# Patient Record
Sex: Female | Born: 1945 | Race: White | Hispanic: No | Marital: Single | Smoking: Never smoker
Health system: Southern US, Community
[De-identification: ages and names within clinical notes are randomized; demographics above are authoritative.]

## PROBLEM LIST (undated history)

## (undated) ENCOUNTER — Ambulatory Visit: Discharge status: Absent without leave | Payer: Medicare HMO

## (undated) DIAGNOSIS — M1990 Unspecified osteoarthritis, unspecified site: Secondary | ICD-10-CM

## (undated) DIAGNOSIS — D219 Benign neoplasm of connective and other soft tissue, unspecified: Secondary | ICD-10-CM

## (undated) DIAGNOSIS — D649 Anemia, unspecified: Secondary | ICD-10-CM

## (undated) DIAGNOSIS — N920 Excessive and frequent menstruation with regular cycle: Secondary | ICD-10-CM

## (undated) DIAGNOSIS — J302 Other seasonal allergic rhinitis: Secondary | ICD-10-CM

## (undated) HISTORY — DX: Benign neoplasm of connective and other soft tissue, unspecified: D21.9

## (undated) HISTORY — DX: Excessive and frequent menstruation with regular cycle: N92.0

## (undated) HISTORY — PX: CERVICAL SPINE SURGERY: SHX589

---

## 1981-09-22 HISTORY — PX: DILATION AND CURETTAGE OF UTERUS: SHX78

## 1991-09-23 DIAGNOSIS — D219 Benign neoplasm of connective and other soft tissue, unspecified: Secondary | ICD-10-CM

## 1991-09-23 HISTORY — DX: Benign neoplasm of connective and other soft tissue, unspecified: D21.9

## 1992-02-21 HISTORY — PX: ABDOMINAL HYSTERECTOMY: SHX81

## 2005-03-06 ENCOUNTER — Ambulatory Visit: Payer: Self-pay | Admitting: Family Medicine

## 2006-04-02 ENCOUNTER — Ambulatory Visit: Payer: Self-pay | Admitting: Family Medicine

## 2006-11-23 ENCOUNTER — Ambulatory Visit: Payer: Self-pay | Admitting: Family Medicine

## 2007-04-08 ENCOUNTER — Ambulatory Visit: Payer: Self-pay | Admitting: Family Medicine

## 2008-03-17 ENCOUNTER — Ambulatory Visit: Payer: Self-pay

## 2008-03-20 ENCOUNTER — Ambulatory Visit: Payer: Self-pay

## 2008-06-05 ENCOUNTER — Ambulatory Visit: Payer: Self-pay

## 2009-07-05 ENCOUNTER — Ambulatory Visit: Payer: Self-pay

## 2010-08-01 ENCOUNTER — Ambulatory Visit: Payer: Self-pay | Admitting: Nurse Practitioner

## 2012-08-05 ENCOUNTER — Ambulatory Visit: Payer: Self-pay

## 2013-10-29 ENCOUNTER — Ambulatory Visit: Payer: Self-pay | Admitting: Family Medicine

## 2013-10-29 LAB — URINALYSIS, COMPLETE
Bilirubin,UR: NEGATIVE
Blood: NEGATIVE
GLUCOSE, UR: NEGATIVE mg/dL (ref 0–75)
KETONE: NEGATIVE
NITRITE: NEGATIVE
PH: 6.5 (ref 4.5–8.0)
PROTEIN: NEGATIVE
SPECIFIC GRAVITY: 1.015 (ref 1.003–1.030)

## 2013-10-29 LAB — CBC WITH DIFFERENTIAL/PLATELET
Basophil #: 0.1 10*3/uL (ref 0.0–0.1)
Basophil %: 0.6 %
Eosinophil #: 0.1 10*3/uL (ref 0.0–0.7)
Eosinophil %: 1.1 %
HCT: 40.9 % (ref 35.0–47.0)
HGB: 13.7 g/dL (ref 12.0–16.0)
Lymphocyte #: 1.5 10*3/uL (ref 1.0–3.6)
Lymphocyte %: 14.1 %
MCH: 29.5 pg (ref 26.0–34.0)
MCHC: 33.4 g/dL (ref 32.0–36.0)
MCV: 88 fL (ref 80–100)
MONO ABS: 1 x10 3/mm — AB (ref 0.2–0.9)
Monocyte %: 9 %
Neutrophil #: 8 10*3/uL — ABNORMAL HIGH (ref 1.4–6.5)
Neutrophil %: 75.2 %
Platelet: 244 10*3/uL (ref 150–440)
RBC: 4.64 10*6/uL (ref 3.80–5.20)
RDW: 13.2 % (ref 11.5–14.5)
WBC: 10.7 10*3/uL (ref 3.6–11.0)

## 2013-10-29 LAB — RAPID INFLUENZA A&B ANTIGENS

## 2013-10-29 LAB — RAPID STREP-A WITH REFLX: Micro Text Report: NEGATIVE

## 2013-10-31 LAB — URINE CULTURE

## 2013-11-01 LAB — BETA STREP CULTURE(ARMC)

## 2014-04-02 ENCOUNTER — Emergency Department: Payer: Self-pay | Admitting: Emergency Medicine

## 2014-04-02 LAB — COMPREHENSIVE METABOLIC PANEL
ALBUMIN: 3.8 g/dL (ref 3.4–5.0)
ALK PHOS: 60 U/L
Anion Gap: 6 — ABNORMAL LOW (ref 7–16)
BUN: 10 mg/dL (ref 7–18)
Bilirubin,Total: 0.4 mg/dL (ref 0.2–1.0)
Calcium, Total: 9 mg/dL (ref 8.5–10.1)
Chloride: 107 mmol/L (ref 98–107)
Co2: 28 mmol/L (ref 21–32)
Creatinine: 0.98 mg/dL (ref 0.60–1.30)
EGFR (African American): >60
EGFR (Non-African Amer.): 60 — ABNORMAL LOW
Glucose: 87 mg/dL (ref 65–99)
Osmolality: 280 (ref 275–301)
Potassium: 4.2 mmol/L (ref 3.5–5.1)
SGOT(AST): 24 U/L (ref 15–37)
SGPT (ALT): 20 U/L (ref 12–78)
SODIUM: 141 mmol/L (ref 136–145)
Total Protein: 8 g/dL (ref 6.4–8.2)

## 2014-04-02 LAB — URINALYSIS, COMPLETE
BLOOD: NEGATIVE
Bacteria: NONE SEEN
Bilirubin,UR: NEGATIVE
Glucose,UR: NEGATIVE mg/dL (ref 0–75)
Ketone: NEGATIVE
Leukocyte Esterase: NEGATIVE
Nitrite: NEGATIVE
PH: 7 (ref 4.5–8.0)
PROTEIN: NEGATIVE
RBC, UR: NONE SEEN /HPF (ref 0–5)
Specific Gravity: 1.003 (ref 1.003–1.030)
WBC UR: NONE SEEN /HPF (ref 0–5)

## 2014-04-02 LAB — CBC
HCT: 38.2 % (ref 35.0–47.0)
HGB: 12.5 g/dL (ref 12.0–16.0)
MCH: 29.4 pg (ref 26.0–34.0)
MCHC: 32.7 g/dL (ref 32.0–36.0)
MCV: 90 fL (ref 80–100)
Platelet: 207 10*3/uL (ref 150–440)
RBC: 4.25 10*6/uL (ref 3.80–5.20)
RDW: 13.2 % (ref 11.5–14.5)
WBC: 4.9 10*3/uL (ref 3.6–11.0)

## 2014-04-02 LAB — TROPONIN I

## 2014-04-14 ENCOUNTER — Emergency Department: Payer: Self-pay

## 2014-04-14 LAB — CBC WITH DIFFERENTIAL/PLATELET
Basophil #: 0.1 10*3/uL (ref 0.0–0.1)
Basophil %: 1 %
EOS ABS: 0.1 10*3/uL (ref 0.0–0.7)
EOS PCT: 1.5 %
HCT: 38.7 % (ref 35.0–47.0)
HGB: 12.6 g/dL (ref 12.0–16.0)
LYMPHS ABS: 1.3 10*3/uL (ref 1.0–3.6)
Lymphocyte %: 22.6 %
MCH: 29.6 pg (ref 26.0–34.0)
MCHC: 32.6 g/dL (ref 32.0–36.0)
MCV: 91 fL (ref 80–100)
MONOS PCT: 10.4 %
Monocyte #: 0.6 x10 3/mm (ref 0.2–0.9)
NEUTROS ABS: 3.8 10*3/uL (ref 1.4–6.5)
NEUTROS PCT: 64.5 %
Platelet: 234 10*3/uL (ref 150–440)
RBC: 4.27 10*6/uL (ref 3.80–5.20)
RDW: 13.3 % (ref 11.5–14.5)
WBC: 5.9 10*3/uL (ref 3.6–11.0)

## 2014-04-14 LAB — COMPREHENSIVE METABOLIC PANEL
AST: 25 U/L (ref 15–37)
Albumin: 3.7 g/dL (ref 3.4–5.0)
Alkaline Phosphatase: 61 U/L
Anion Gap: 7 (ref 7–16)
BILIRUBIN TOTAL: 0.2 mg/dL (ref 0.2–1.0)
BUN: 11 mg/dL (ref 7–18)
Calcium, Total: 9.1 mg/dL (ref 8.5–10.1)
Chloride: 106 mmol/L (ref 98–107)
Co2: 27 mmol/L (ref 21–32)
Creatinine: 0.93 mg/dL (ref 0.60–1.30)
EGFR (Non-African Amer.): >60
Glucose: 106 mg/dL — ABNORMAL HIGH (ref 65–99)
Osmolality: 279 (ref 275–301)
Potassium: 4.3 mmol/L (ref 3.5–5.1)
SGPT (ALT): 23 U/L
Sodium: 140 mmol/L (ref 136–145)
TOTAL PROTEIN: 8 g/dL (ref 6.4–8.2)

## 2014-04-14 LAB — URINALYSIS, COMPLETE
BILIRUBIN, UR: NEGATIVE
Blood: NEGATIVE
GLUCOSE, UR: NEGATIVE mg/dL (ref 0–75)
Ketone: NEGATIVE
LEUKOCYTE ESTERASE: NEGATIVE
Nitrite: NEGATIVE
PROTEIN: NEGATIVE
Ph: 6 (ref 4.5–8.0)
RBC,UR: <1 /HPF (ref 0–5)
SPECIFIC GRAVITY: 1.004 (ref 1.003–1.030)
WBC UR: <1 /HPF (ref 0–5)

## 2014-04-14 LAB — TROPONIN I: Troponin-I: <0.02 ng/mL

## 2014-07-17 ENCOUNTER — Ambulatory Visit: Payer: Self-pay

## 2014-07-17 LAB — URINALYSIS, COMPLETE
Bilirubin,UR: NEGATIVE
Blood: NEGATIVE
GLUCOSE, UR: NEGATIVE
Ketone: NEGATIVE
Nitrite: NEGATIVE
PROTEIN: NEGATIVE
Ph: 6 (ref 5.0–8.0)
Specific Gravity: 1.01 (ref 1.000–1.030)
WBC UR: >30 /HPF (ref 0–5)

## 2014-07-19 LAB — URINE CULTURE

## 2015-01-23 ENCOUNTER — Other Ambulatory Visit: Payer: Self-pay | Admitting: Student

## 2015-01-23 DIAGNOSIS — Z13820 Encounter for screening for osteoporosis: Secondary | ICD-10-CM

## 2015-01-24 ENCOUNTER — Other Ambulatory Visit: Payer: Self-pay | Admitting: Student

## 2015-01-24 DIAGNOSIS — E278 Other specified disorders of adrenal gland: Secondary | ICD-10-CM

## 2015-02-20 ENCOUNTER — Ambulatory Visit: Payer: Medicare HMO

## 2015-02-20 ENCOUNTER — Ambulatory Visit: Payer: Self-pay

## 2015-02-22 ENCOUNTER — Other Ambulatory Visit: Payer: Self-pay

## 2015-03-06 ENCOUNTER — Ambulatory Visit
Admission: RE | Admit: 2015-03-06 | Discharge: 2015-03-06 | Disposition: A | Discharge status: Home / Self Care | Payer: Medicare HMO | Source: Ambulatory Visit | Attending: Student | Admitting: Student

## 2015-03-06 DIAGNOSIS — Z13820 Encounter for screening for osteoporosis: Secondary | ICD-10-CM

## 2015-03-06 DIAGNOSIS — E279 Disorder of adrenal gland, unspecified: Secondary | ICD-10-CM | POA: Insufficient documentation

## 2015-03-06 DIAGNOSIS — E278 Other specified disorders of adrenal gland: Secondary | ICD-10-CM

## 2016-02-20 ENCOUNTER — Other Ambulatory Visit (HOSPITAL_COMMUNITY): Payer: Self-pay | Admitting: Family Medicine

## 2016-02-20 DIAGNOSIS — E278 Other specified disorders of adrenal gland: Secondary | ICD-10-CM

## 2016-02-29 ENCOUNTER — Ambulatory Visit
Admission: RE | Admit: 2016-02-29 | Discharge: 2016-02-29 | Disposition: A | Discharge status: Home / Self Care | Payer: Medicare HMO | Source: Ambulatory Visit | Attending: Family Medicine | Admitting: Family Medicine

## 2016-02-29 DIAGNOSIS — D3502 Benign neoplasm of left adrenal gland: Secondary | ICD-10-CM | POA: Diagnosis not present

## 2016-02-29 DIAGNOSIS — E278 Other specified disorders of adrenal gland: Secondary | ICD-10-CM

## 2016-02-29 DIAGNOSIS — E279 Disorder of adrenal gland, unspecified: Secondary | ICD-10-CM | POA: Diagnosis present

## 2017-01-01 ENCOUNTER — Ambulatory Visit
Admission: EM | Admit: 2017-01-01 | Discharge: 2017-01-01 | Disposition: A | Discharge status: Home / Self Care | Payer: Medicare HMO | Attending: Emergency Medicine | Admitting: Emergency Medicine

## 2017-01-01 ENCOUNTER — Ambulatory Visit (INDEPENDENT_AMBULATORY_CARE_PROVIDER_SITE_OTHER): Payer: Medicare HMO

## 2017-01-01 DIAGNOSIS — M545 Low back pain, unspecified: Secondary | ICD-10-CM

## 2017-01-01 DIAGNOSIS — R030 Elevated blood-pressure reading, without diagnosis of hypertension: Secondary | ICD-10-CM

## 2017-01-01 LAB — BASIC METABOLIC PANEL
ANION GAP: 6 (ref 5–15)
BUN: 10 mg/dL (ref 6–20)
CO2: 27 mmol/L (ref 22–32)
Calcium: 9.2 mg/dL (ref 8.9–10.3)
Chloride: 99 mmol/L — ABNORMAL LOW (ref 101–111)
Creatinine, Ser: 0.82 mg/dL (ref 0.44–1.00)
GFR calc non Af Amer: >60 mL/min (ref >60)
Glucose, Bld: 90 mg/dL (ref 65–99)
POTASSIUM: 3.7 mmol/L (ref 3.5–5.1)
Sodium: 132 mmol/L — ABNORMAL LOW (ref 135–145)

## 2017-01-01 LAB — URINALYSIS, COMPLETE (UACMP) WITH MICROSCOPIC
Bilirubin Urine: NEGATIVE
Glucose, UA: NEGATIVE mg/dL
Hgb urine dipstick: NEGATIVE
KETONES UR: NEGATIVE mg/dL
LEUKOCYTES UA: NEGATIVE
Nitrite: NEGATIVE
PH: 6.5 (ref 5.0–8.0)
Protein, ur: NEGATIVE mg/dL
SPECIFIC GRAVITY, URINE: 1.01 (ref 1.005–1.030)

## 2017-01-01 MED ORDER — TRAMADOL HCL 50 MG PO TABS: 50.0000 mg | ORAL_TABLET | Freq: Four times a day (QID) | ORAL | 0 refills | Status: DC | PRN

## 2017-01-01 MED ORDER — ACETAMINOPHEN 500 MG PO TABS
1000.0000 mg | ORAL_TABLET | Freq: Once | ORAL | Status: AC
  Administered 2017-01-01: 1000 mg via ORAL

## 2017-01-01 MED ORDER — KETOROLAC TROMETHAMINE 60 MG/2ML IM SOLN: 30.0000 mg | Freq: Once | INTRAMUSCULAR | Status: DC

## 2017-01-01 MED ORDER — IBUPROFEN 600 MG PO TABS: 600.0000 mg | ORAL_TABLET | Freq: Four times a day (QID) | ORAL | 0 refills | Status: DC | PRN

## 2017-01-01 MED ORDER — PREDNISONE 20 MG PO TABS: 40.0000 mg | ORAL_TABLET | Freq: Every day | ORAL | 0 refills | Status: AC

## 2017-01-01 NOTE — ED Provider Notes (Signed)
HPI  SUBJECTIVE:  Janet Scott is a 71 y.o. female who presents with intermittent low back pain that she describes as sore, lasting minutes, states that it alternates between one side and the other for the past 11-13 days. States that it radiates down the lateral thighs occasionally. She denies it  being like spasm or pulling. She tried cranberry juice, ibuprofen 600 mg on a regular basis, physical therapy exercises and heat. Ibuprofen seemed to help most. Symptoms are worse with going from sitting to lying, turning over in bed, walking, bending forward. Was picking up sticks several days prior to back pains starting. denies N/V, fevers, flank pain, abdominal pain, urinary urgency, frequency, dysuria, cloudy or odorous urine, hematuria.  No syncope. No saddle anesthesia, distal weakness/numbness, bilateral radicular leg pain/weakness, fevers/night sweats, recent h/o trauma, neurological deficits,  bladder/ bowel incontinence, urinary retention, h/o CA / multiple myleoma, unexplained weight loss, pain worse at night,  h/o prolonged steroid use, h/o osteopenia, h/o IVDU, h/o HIV,  known AAA.  States feels similar to previous episodes of back pain. no h/o nephrolithiasis.    PMH:  pyleonephritis L adrenal gland mass, osteopenia No DM, HTN, GIB, ulcers.   Allergy to etodolac. etodolac- redness only, no lip swelling difficulty breathing.  PMD: [facility]  History reviewed. No pertinent past medical history.  History reviewed. No pertinent surgical history.  History reviewed. No pertinent family history.  Social History  Substance Use Topics  . Smoking status: Never Smoker  . Smokeless tobacco: Never Used  . Alcohol use No     Current Facility-Administered Medications:  .  ketorolac (TORADOL) injection 30 mg, 30 mg, Intramuscular, Once, Melynda Ripple, MD  Current Outpatient Prescriptions:  .  ibuprofen (ADVIL,MOTRIN) 600 MG tablet, Take 1 tablet (600 mg total) by mouth  every 6 (six) hours as needed., Disp: 30 tablet, Rfl: 0 .  predniSONE (DELTASONE) 20 MG tablet, Take 2 tablets (40 mg total) by mouth daily with breakfast., Disp: 10 tablet, Rfl: 0 .  traMADol (ULTRAM) 50 MG tablet, Take 1 tablet (50 mg total) by mouth every 6 (six) hours as needed., Disp: 20 tablet, Rfl: 0  Allergies  Allergen Reactions  . Lodine [Etodolac] Photosensitivity     ROS  As noted in HPI.   Physical Exam  BP (!) 178/82 (BP Location: Right Arm)   Pulse 78   Temp 98 F (36.7 C) (Oral)   Resp 18   Ht 5\' 2"  (1.575 m)   Wt 168 lb (76.2 kg)   SpO2 100%   BMI 30.73 kg/m    Constitutional: Well developed, well nourished, no acute distress Eyes:  EOMI, conjunctiva normal bilaterally HENT: Normocephalic, atraumatic,mucus membranes moist Respiratory: Normal inspiratory effort Cardiovascular: Normal rate GI: nondistended. No suprapubic tenderness, Flank tenderness  skin: No rash, skin intact Musculoskeletal: no CVAT. - paralumbar tenderness But she points to this area as the location of her pain, - muscle spasm. No bony tenderness Along the L-spine, sacrum, SI joint. Bilateral lower extremities nontender without new rashes or color change, baseline ROM with intact DP pulses, . No pain with int/ext rotation flex/extension hips bilaterally. No pain with active flexion/extension of hips. SLR neg bilaterally. Sensation baseline light touch bilaterally for Pt, DTR's symmetric and intact bilaterally KJ, Motor symmetric bilateral 5/5 hip flexion, quadriceps, hamstrings, EHL, foot dorsiflexion, foot plantarflexion, gait normal. Neurologic: Alert & oriented x 3, no focal neuro deficits Psychiatric: Speech and behavior appropriate   ED Course   Medications  ketorolac (  TORADOL) injection 30 mg (not administered)  acetaminophen (TYLENOL) tablet 1,000 mg (1,000 mg Oral Given 01/01/17 1143)    Orders Placed This Encounter  Procedures  . DG Lumbar Spine Complete    Standing Status:    Standing    Number of Occurrences:   1    Order Specific Question:   Reason for Exam (SYMPTOM  OR DIAGNOSIS REQUIRED)    Answer:   alternating LBP x 12 days  . Urinalysis, Complete w Microscopic    Standing Status:   Standing    Number of Occurrences:   1  . Basic metabolic panel    Standing Status:   Standing    Number of Occurrences:   1  . Measure blood pressure    Standing Status:   Standing    Number of Occurrences:   1    Results for orders placed or performed during the hospital encounter of 01/01/17 (from the past 24 hour(s))  Urinalysis, Complete w Microscopic     Status: Abnormal   Collection Time: 01/01/17 10:54 AM  Result Value Ref Range   Color, Urine YELLOW YELLOW   APPearance CLEAR CLEAR   Specific Gravity, Urine 1.010 1.005 - 1.030   pH 6.5 5.0 - 8.0   Glucose, UA NEGATIVE NEGATIVE mg/dL   Hgb urine dipstick NEGATIVE NEGATIVE   Bilirubin Urine NEGATIVE NEGATIVE   Ketones, ur NEGATIVE NEGATIVE mg/dL   Protein, ur NEGATIVE NEGATIVE mg/dL   Nitrite NEGATIVE NEGATIVE   Leukocytes, UA NEGATIVE NEGATIVE   Squamous Epithelial / LPF 6-30 (A) NONE SEEN   WBC, UA 0-5 0 - 5 WBC/hpf   RBC / HPF 0-5 0 - 5 RBC/hpf   Bacteria, UA FEW (A) NONE SEEN  Basic metabolic panel     Status: Abnormal   Collection Time: 01/01/17 11:40 AM  Result Value Ref Range   Sodium 132 (L) 135 - 145 mmol/L   Potassium 3.7 3.5 - 5.1 mmol/L   Chloride 99 (L) 101 - 111 mmol/L   CO2 27 22 - 32 mmol/L   Glucose, Bld 90 65 - 99 mg/dL   BUN 10 6 - 20 mg/dL   Creatinine, Ser 0.82 0.44 - 1.00 mg/dL   Calcium 9.2 8.9 - 10.3 mg/dL   GFR calc non Af Amer >60 >60 mL/min   GFR calc Af Amer >60 >60 mL/min   Anion gap 6 5 - 15   Dg Lumbar Spine Complete  Result Date: 01/01/2017 CLINICAL DATA:  Low back pain EXAM: LUMBAR SPINE - COMPLETE 4+ VIEW COMPARISON:  Lumbar MRI 11/23/2006 FINDINGS: Negative for fracture or mass lesion.  Mild scoliosis. 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 This is unchanged from the prior MRI. Mild retrolisthesis L3-4 L4-5 also unchanged. Disc degeneration and spurring at L2-3, L3-4, L4-5, and L5-S1. Constipation with stool throughout the colon IMPRESSION: Multilevel degenerative changes similar to the MRI of 11/23/2006 10 mm anterolisthesis L5-S1 due to bilateral pars defects of L5. There is foraminal encroachment bilaterally at L5-S1. Electronically Signed   By: Franchot Gallo M.D.   On: 01/01/2017 11:49    ED Clinical Impression  Acute bilateral low back pain without sciatica  Elevated blood pressure reading without diagnosis of hypertension   ED Assessment/Plan  [place] narcotic database reviewed. Pt with no narcotic rx in the past 6 months. .  No previous blood pressures for comparison.  No evidence of uti, nephrolithiasis.    Imaging independently reviewed. Constipation. Multilevel degenerative changes  similar to MRI from 2008, 10 mm anterolisthesis L5-S1 with for minimal encroachment at L5-S1. See radiology report for details.  checking BMP to evaluate kidney function, L-spine to evaluate for acute bony changes, aortic abdominal aneurysm, giving Toradol 30 and 1 g of Tylenol. Had extensive discussion with patient about her allergy to etodolac, she states is just diffuse redness and wants to try Toradol especially as she has been tolerating ibuprofen. Also checking UA. We'll reevaluate.   UA: No UTI, hematuria. Slightly contaminated sample. BMP normal creatinine.   Ended up not giving pt tordaol.   Pt hypertensive today. Had several high measurements in the 170-180/80-90 Does not measure her blood pressure at home because she has no previous diagnosis hypertension. Pt has no historical evidence of end organ damage. Pt denies any CNS type sx such as HA, visual changes, focal paresis, or new onset seizure activity. Pt denies any CV sx such as CP, dyspnea, palpitations, pedal edema, tearing pain radiating to back or abd. Pt denied any  renal sx such as anuria or hematuria. Pt denies illicit drug use, most notably cocaine, or recent use of OTC medications such as nasal decongestants. Discussed importance of lifestyle modifications as important first steps. Patient to get a blood pressure monitor and keep a log of her blood pressure at home.. Pt to f/u as OP.    Pt ambulatory in the ED. Home with NSAID/APAP, tramadol, steriod. Pt to f/u with PMD.  Discussed labs, medical decision-making, and plan for follow-up with the patient.  Discussed signs and symptoms that should prompt return to the emergency department.  Patient agrees with plan.  Meds ordered this encounter  Medications  . ketorolac (TORADOL) injection 30 mg  . acetaminophen (TYLENOL) tablet 1,000 mg  . ibuprofen (ADVIL,MOTRIN) 600 MG tablet    Sig: Take 1 tablet (600 mg total) by mouth every 6 (six) hours as needed.    Dispense:  30 tablet    Refill:  0  . predniSONE (DELTASONE) 20 MG tablet    Sig: Take 2 tablets (40 mg total) by mouth daily with breakfast.    Dispense:  10 tablet    Refill:  0  . traMADol (ULTRAM) 50 MG tablet    Sig: Take 1 tablet (50 mg total) by mouth every 6 (six) hours as needed.    Dispense:  20 tablet    Refill:  0    *This clinic note was created using [employer]. Therefore, there may be occasional mistakes despite careful proofreading.  ?    Melynda Ripple, MD 01/01/17 1235

## 2017-01-01 NOTE — ED Triage Notes (Signed)
Pt c/o back pain, Easter Sunday she woke up and she felt like her lower back was tight. And it has continued. She has been taking Ibuprofen to help some, the tightness comes and goes and she has been using a heating pad.

## 2017-01-01 NOTE — Discharge Instructions (Signed)
Blood pressure was high today. That could be due to many factors, but you need to have it rechecked to make sure that you do not have a new diagnosis of hypertension that would require medications. Decrease your salt intake. diet and exercise will lower your blood pressure significantly. It is important to keep your blood pressure under good control, as having a elevated for prolonged periods of time significantly increases your risk of stroke, heart attacks, kidney damage, eye damage, and other problems. Measure your blood pressure once a day, preferably at the same time every day. Keep a log of this and bring it to your next doctor's appointment. Return here in a week for blood pressure recheck if you're able to find a primary care physician by then. Return immediately to the ER if you start having chest pain, headache, problems seeing, problems talking, problems walking, if you feel like you're about to pass out, if you do pass out, if you have a seizure, or for any other concerns.Janet Scott

## 2017-04-07 ENCOUNTER — Encounter: Payer: Self-pay | Admitting: Emergency Medicine

## 2017-04-07 ENCOUNTER — Emergency Department
Admission: EM | Admit: 2017-04-07 | Discharge: 2017-04-07 | Disposition: A | Discharge status: Home / Self Care | Payer: Medicare HMO | Attending: Emergency Medicine | Admitting: Emergency Medicine

## 2017-04-07 DIAGNOSIS — Y929 Unspecified place or not applicable: Secondary | ICD-10-CM | POA: Diagnosis not present

## 2017-04-07 DIAGNOSIS — S39012A Strain of muscle, fascia and tendon of lower back, initial encounter: Secondary | ICD-10-CM | POA: Diagnosis not present

## 2017-04-07 DIAGNOSIS — Y999 Unspecified external cause status: Secondary | ICD-10-CM | POA: Diagnosis not present

## 2017-04-07 DIAGNOSIS — X58XXXA Exposure to other specified factors, initial encounter: Secondary | ICD-10-CM | POA: Diagnosis not present

## 2017-04-07 DIAGNOSIS — Y939 Activity, unspecified: Secondary | ICD-10-CM | POA: Diagnosis not present

## 2017-04-07 DIAGNOSIS — S3992XA Unspecified injury of lower back, initial encounter: Secondary | ICD-10-CM | POA: Diagnosis present

## 2017-04-07 MED ORDER — TRAMADOL HCL 50 MG PO TABS: 50.0000 mg | ORAL_TABLET | Freq: Two times a day (BID) | ORAL | 0 refills | Status: DC | PRN

## 2017-04-07 MED ORDER — METHYLPREDNISOLONE 4 MG PO TBPK: ORAL_TABLET | ORAL | 0 refills | Status: DC

## 2017-04-07 MED ORDER — METHYLPREDNISOLONE SODIUM SUCC 125 MG IJ SOLR
60.0000 mg | Freq: Once | INTRAMUSCULAR | Status: AC
  Administered 2017-04-07: 60 mg via INTRAMUSCULAR
  Filled 2017-04-07: qty 2

## 2017-04-07 NOTE — ED Notes (Signed)
See triage note developed back pain about 2 weeks ago  W/o injury ambulates well   Had similar episode in past which she was seen for at urgent care  Hx of chronic back pain

## 2017-04-07 NOTE — ED Provider Notes (Signed)
[facility] Emergency Department Provider Note   ____________________________________________   First MD Initiated Contact with Patient 04/07/17 0802     (approximate)  I have reviewed the triage vital signs and the nursing notes.   HISTORY  Chief Complaint Back Pain    HPI Janet Scott is a 71 y.o. female patient complain of back pain for 2 weeks. Patient has a history of intermittent back pain. Patient was seen by PCP but was unable to get an  appointment until 05/05/2017.Patient state onset of pain after sitting on a low sofa 2 weeks ago. Patient rates the pain as a 4/10. Patient describes pain as "aching". Patient denies any radicular component to her pain. Patient denies bladder or bowel dysfunction. No relief with over-the-counter medications.  History reviewed. No pertinent past medical history.  There are no active problems to display for this patient.   History reviewed. No pertinent surgical history.  Prior to Admission medications   Medication Sig Start Date End Date Taking? Authorizing Provider  ibuprofen (ADVIL,MOTRIN) 600 MG tablet Take 1 tablet (600 mg total) by mouth every 6 (six) hours as needed. 01/01/17   Melynda Ripple, MD  methylPREDNISolone (MEDROL DOSEPAK) 4 MG TBPK tablet Take Tapered dose as directed 04/07/17   Sable Feil, PA-C  traMADol (ULTRAM) 50 MG tablet Take 1 tablet (50 mg total) by mouth every 6 (six) hours as needed. 01/01/17   Melynda Ripple, MD  traMADol (ULTRAM) 50 MG tablet Take 1 tablet (50 mg total) by mouth every 12 (twelve) hours as needed. 04/07/17   Sable Feil, PA-C    Allergies Lodine [etodolac]  History reviewed. No pertinent family history.  Social History Social History  Substance Use Topics  . Smoking status: Never Smoker  . Smokeless tobacco: Never Used  . Alcohol use No    Review of Systems  Constitutional: No fever/chills Eyes: No visual changes. ENT: No sore  throat. Cardiovascular: Denies chest pain. Respiratory: Denies shortness of breath. Gastrointestinal: No abdominal pain.  No nausea, no vomiting.  No diarrhea.  No constipation. Genitourinary: Negative for dysuria. Musculoskeletal:Positive for back pain. Skin: Negative for rash. Neurological: Negative for headaches, focal weakness or numbness. Allergic/Immunilogical:  etodolac ____________________________________________   PHYSICAL EXAM:  VITAL SIGNS: ED Triage Vitals  Enc Vitals Group     BP 04/07/17 0738 (!) 137/92     Pulse Rate 04/07/17 0738 91     Resp 04/07/17 0738 16     Temp 04/07/17 0738 98.2 F (36.8 C)     Temp Source 04/07/17 0738 Oral     SpO2 04/07/17 0738 100 %     Weight 04/07/17 0739 168 lb (76.2 kg)     Height --      Head Circumference --      Peak Flow --      Pain Score 04/07/17 0738 4     Pain Loc --      Pain Edu? --      Excl. in [place]? --     Constitutional: Alert and oriented. Well appearing and in no acute distress. Neck: No stridor.  Cardiovascular: Normal rate, regular rhythm. Grossly normal heart sounds.  Good peripheral circulation. Respiratory: Normal respiratory effort.  No retractions. Lungs CTAB. Musculoskeletal: No obvious spinal deformity. There is no guarding palpation of the spinal processes. Patient has decreased range of motion with flexion and lateral movements. No pain with internal and external rotation of the hips. Patient has bilateral negative straight leg  test. Neurologic:  Normal speech and language. No gross focal neurologic deficits are appreciated. No gait instability. Skin:  Skin is warm, dry and intact. No rash noted. Psychiatric: Mood and affect are normal. Speech and behavior are normal.  ____________________________________________   LABS (all labs ordered are listed, but only abnormal results are displayed)  Labs Reviewed - No data to  display ____________________________________________  EKG   ____________________________________________  RADIOLOGY  No results found.  ____________________________________________   PROCEDURES  Procedure(s) performed: None  Procedures  Critical Care performed: No  ____________________________________________   INITIAL IMPRESSION / ASSESSMENT AND PLAN / ED COURSE  Pertinent labs & imaging results that were available during my care of the patient were reviewed by me and considered in my medical decision making (see chart for details).  Lumbar strain. Patient given discharge care instructions. Patient advised follow-up PCP for continual care.      ____________________________________________   FINAL CLINICAL IMPRESSION(S) / ED DIAGNOSES  Final diagnoses:  Strain of lumbar region, initial encounter      NEW MEDICATIONS STARTED DURING THIS VISIT:  New Prescriptions   METHYLPREDNISOLONE (MEDROL DOSEPAK) 4 MG TBPK TABLET    Take Tapered dose as directed   TRAMADOL (ULTRAM) 50 MG TABLET    Take 1 tablet (50 mg total) by mouth every 12 (twelve) hours as needed.     Note:  This document was prepared using Dragon voice recognition software and may include unintentional dictation errors.    Sable Feil, PA-C 04/07/17 0375    Eula Listen, MD 04/07/17 [phone]

## 2017-04-07 NOTE — ED Triage Notes (Signed)
Pt to ed with c/o back pain over the last 2 weeks.  Pt reports hx of back pain several years ago.  Pt states she was in April for same at urgent care in [facility] and was started on prednisone and muscle relaxers with some relief.  Pt ambulates well.

## 2017-04-23 ENCOUNTER — Other Ambulatory Visit: Payer: Self-pay | Admitting: Unknown Physician Specialty

## 2017-04-23 DIAGNOSIS — G8929 Other chronic pain: Principal | ICD-10-CM

## 2017-04-23 DIAGNOSIS — M542 Cervicalgia: Secondary | ICD-10-CM

## 2017-04-23 DIAGNOSIS — M549 Dorsalgia, unspecified: Principal | ICD-10-CM

## 2017-04-27 ENCOUNTER — Ambulatory Visit
Admission: RE | Admit: 2017-04-27 | Discharge: 2017-04-27 | Disposition: A | Discharge status: Home / Self Care | Payer: Medicare HMO | Source: Ambulatory Visit | Attending: Unknown Physician Specialty | Admitting: Unknown Physician Specialty

## 2017-04-27 DIAGNOSIS — M5030 Other cervical disc degeneration, unspecified cervical region: Secondary | ICD-10-CM | POA: Insufficient documentation

## 2017-04-27 DIAGNOSIS — M549 Dorsalgia, unspecified: Secondary | ICD-10-CM | POA: Insufficient documentation

## 2017-04-27 DIAGNOSIS — G8929 Other chronic pain: Secondary | ICD-10-CM

## 2017-04-27 DIAGNOSIS — M542 Cervicalgia: Secondary | ICD-10-CM | POA: Diagnosis present

## 2017-04-27 DIAGNOSIS — M4802 Spinal stenosis, cervical region: Secondary | ICD-10-CM | POA: Diagnosis not present

## 2017-06-01 ENCOUNTER — Encounter: Payer: Self-pay | Admitting: Obstetrics and Gynecology

## 2017-06-01 ENCOUNTER — Ambulatory Visit (INDEPENDENT_AMBULATORY_CARE_PROVIDER_SITE_OTHER): Payer: Medicare HMO | Admitting: Obstetrics and Gynecology

## 2017-06-01 ENCOUNTER — Emergency Department
Admission: EM | Admit: 2017-06-01 | Discharge: 2017-06-01 | Disposition: A | Discharge status: Home / Self Care | Payer: Medicare HMO | Attending: Emergency Medicine | Admitting: Emergency Medicine

## 2017-06-01 ENCOUNTER — Encounter: Payer: Self-pay | Admitting: Emergency Medicine

## 2017-06-01 DIAGNOSIS — Z79899 Other long term (current) drug therapy: Secondary | ICD-10-CM | POA: Diagnosis not present

## 2017-06-01 DIAGNOSIS — N8110 Cystocele, unspecified: Secondary | ICD-10-CM | POA: Diagnosis not present

## 2017-06-01 DIAGNOSIS — N816 Rectocele: Secondary | ICD-10-CM | POA: Diagnosis not present

## 2017-06-01 DIAGNOSIS — N993 Prolapse of vaginal vault after hysterectomy: Secondary | ICD-10-CM

## 2017-06-01 DIAGNOSIS — R102 Pelvic and perineal pain: Secondary | ICD-10-CM | POA: Diagnosis present

## 2017-06-01 DIAGNOSIS — N8189 Other female genital prolapse: Secondary | ICD-10-CM | POA: Diagnosis not present

## 2017-06-01 DIAGNOSIS — N8111 Cystocele, midline: Secondary | ICD-10-CM

## 2017-06-01 NOTE — ED Provider Notes (Signed)
[facility] Emergency Department Provider Note   ____________________________________________    I have reviewed the triage vital signs and the nursing notes.   HISTORY  Chief Complaint Pelvic Pain     HPI Janet Scott is a 71 y.o. female Who presents with complaints of pelvic discomfort. Specifically patient reports that she is having something emerge from her vagina. She reports this started over the last several days and has made her quite anxious. She denies abdominal pain. No dysuria. She is able to urinate well. She has a history of a hysterectomy. She has had 2 kids. She reports occasionally she sees a bulge coming from her vagina but then sometimes it is gone.   History reviewed. No pertinent past medical history.  There are no active problems to display for this patient.   History reviewed. No pertinent surgical history.  Prior to Admission medications   Medication Sig Start Date End Date Taking? Authorizing Provider  ibuprofen (ADVIL,MOTRIN) 600 MG tablet Take 1 tablet (600 mg total) by mouth every 6 (six) hours as needed. 01/01/17   Melynda Ripple, MD  methylPREDNISolone (MEDROL DOSEPAK) 4 MG TBPK tablet Take Tapered dose as directed 04/07/17   Sable Feil, PA-C  traMADol (ULTRAM) 50 MG tablet Take 1 tablet (50 mg total) by mouth every 6 (six) hours as needed. 01/01/17   Melynda Ripple, MD  traMADol (ULTRAM) 50 MG tablet Take 1 tablet (50 mg total) by mouth every 12 (twelve) hours as needed. 04/07/17   Sable Feil, PA-C     Allergies Lodine [etodolac]  No family history on file.  Social History Social History  Substance Use Topics  . Smoking status: Never Smoker  . Smokeless tobacco: Never Used  . Alcohol use No    Review of Systems  Constitutional: No fever/chills Eyes: No visual changes.  ENT: No sore throat. Cardiovascular: Denies chest pain. Respiratory: Denies shortness of breath. Gastrointestinal: No  abdominal pain.  No nausea, no vomiting.   Genitourinary: Negative for dysuria.as above Musculoskeletal: Negative for back pain. Skin: Negative for rash. Neurological: Negative for headaches    ____________________________________________   PHYSICAL EXAM:  VITAL SIGNS: ED Triage Vitals  Enc Vitals Group     BP 06/01/17 0745 (!) 174/79     Pulse Rate 06/01/17 0745 78     Resp 06/01/17 0745 18     Temp 06/01/17 0745 97.9 F (36.6 C)     Temp Source 06/01/17 0745 Oral     SpO2 06/01/17 0745 97 %     Weight --      Height --      Head Circumference --      Peak Flow --      Pain Score 06/01/17 0759 3     Pain Loc --      Pain Edu? --      Excl. in [place]? --     Constitutional: Alert and oriented.  Pleasant and interactive Eyes: Conjunctivae are normal.   Nose: No congestion/rhinnorhea. Mouth/Throat: Mucous membranes are moist.    Cardiovascular: Normal rate, regular rhythm.  Good peripheral circulation. Respiratory: Normal respiratory effort.  No retractions. Lungs CTAB. Gastrointestinal: Soft and nontender. No distention.  No CVA tenderness. Genitourinary: no obvious prolapse on exam however there is likely vaginal wall tissue bulging at the introitus Musculoskeletal:   Warm and well perfused Neurologic:  Normal speech and language. No gross focal neurologic deficits are appreciated.  Skin:  Skin is warm, dry  and intact.  Psychiatric: Mood and affect are normal. Speech and behavior are normal.  ____________________________________________   LABS (all labs ordered are listed, but only abnormal results are displayed)  Labs Reviewed - No data to display ____________________________________________  EKG  None ____________________________________________  RADIOLOGY  None ____________________________________________   PROCEDURES  Procedure(s) performed: No    Critical Care performed: No ____________________________________________   INITIAL IMPRESSION /  ASSESSMENT AND PLAN / ED COURSE  Pertinent labs & imaging results that were available during my care of the patient were reviewed by me and considered in my medical decision making (see chart for details).  Likely prolapsed vaginal wall versus bladder. Discussed with Dr. Kenton Kingfisher of GYN, recommend outpatient follow-up Patient is comfortable with this plan    ____________________________________________   FINAL CLINICAL IMPRESSION(S) / ED DIAGNOSES  Final diagnoses:  Vaginal wall prolapse      NEW MEDICATIONS STARTED DURING THIS VISIT:  Discharge Medication List as of 06/01/2017  8:16 AM       Note:  This document was prepared using Dragon voice recognition software and may include unintentional dictation errors.    Lavonia Drafts, MD 06/01/17 [phone]

## 2017-06-01 NOTE — ED Notes (Signed)
AAOx3.  Skin warm and dry.  NAD 

## 2017-06-01 NOTE — Progress Notes (Signed)
Obstetrics & Gynecology Office Visit   Chief Complaint  Patient presents with  . ER follow up    vaginal wall prolapse   History of Present Illness: 71 y.o. G2P2 female who presents to have a vaginal bulge assessed. The patient states that she stays up-to-date on her health issues. She recently noticed that she had a vaginal bulge.  She presented to the ER this morning.  She has noted this for the last several days.  She denies abdominal pain, vaginal bleeding, issues with urinating.  She does not have to splint to void or to evacuate her bowels. She has a history of total abdominal hysterectomy in the distant past.  She is not sexually active.  She denies issues with urinary incontinence.  She denies issues with her bowels.  The size of the bulge is relatively small and she is not terribly bothered by the bulge.    Past Medical History:  Diagnosis Date  . Leiomyoma 1993  . Menorrhagia     Past Surgical History:  Procedure Laterality Date  . ABDOMINAL HYSTERECTOMY  02/1992   Total  ([facility])  . DILATION AND CURETTAGE OF UTERUS  1983    Gynecologic History: No LMP recorded. Patient has had a hysterectomy.  Obstetric History: G2P2  History reviewed. No pertinent family history.  Social History   Social History  . Marital status: Single    Spouse name: N/A  . Number of children: N/A  . Years of education: N/A   Occupational History  . Not on file.   Social History Main Topics  . Smoking status: Never Smoker  . Smokeless tobacco: Never Used  . Alcohol use No  . Drug use: No  . Sexual activity: Not on file   Other Topics Concern  . Not on file   Social History Narrative  . No narrative on file    Allergies  Allergen Reactions  . Lodine [Etodolac] Photosensitivity    Prior to Admission medications   Medication Sig Start Date End Date Taking? Authorizing Provider  ibuprofen (ADVIL,MOTRIN) 600 MG tablet Take 1 tablet (600 mg total) by mouth every 6 (six) hours  as needed. 01/01/17  Yes Melynda Ripple, MD  methylPREDNISolone (MEDROL DOSEPAK) 4 MG TBPK tablet Take Tapered dose as directed 04/07/17  Yes Sable Feil, PA-C  traMADol (ULTRAM) 50 MG tablet Take 1 tablet (50 mg total) by mouth every 6 (six) hours as needed. 01/01/17  Yes Melynda Ripple, MD    Review of Systems  Constitutional: Negative.   HENT: Negative.   Eyes: Negative.   Respiratory: Negative.   Cardiovascular: Negative.   Gastrointestinal: Negative.   Genitourinary: Negative.   Musculoskeletal: Negative.   Skin: Negative.   Neurological: Negative.   Psychiatric/Behavioral: Negative.      Physical Exam BP 122/80 (BP Location: Left Arm, Patient Position: Sitting, Cuff Size: Normal)   Pulse 77   Ht 5\' 2"  (1.575 m)   Wt 170 lb (77.1 kg)   BMI 31.09 kg/m  No LMP recorded. Patient has had a hysterectomy. Physical Exam  Constitutional: She is oriented to person, place, and time. She appears well-developed and well-nourished. No distress.  Genitourinary: Vagina normal. Pelvic exam was performed with patient supine. There is no rash, tenderness or lesion on the right labia. There is no rash, tenderness or lesion on the left labia. Vagina exhibits no lesion. No erythema, tenderness or bleeding in the vagina. No signs of injury around the vagina. No vaginal discharge found. Right adnexum  does not display mass, does not display tenderness and does not display fullness. Left adnexum does not display mass, does not display tenderness and does not display fullness.   POP-Q measurements were:  Aa: 0, Ba: 0, C: X gh: 4, pb: 5, TVL: 8 Ap: 0, Bp: 0, D: -4Rectal exam shows anal tone abnormal (decreased anal sphincter tone). Rectal exam shows no fissure, no mass and no tenderness.  Genitourinary Comments: Cervix and Uterus surgically absent Anal wink intact  HENT:  Head: Normocephalic and atraumatic.  Eyes: EOM are normal. No scleral icterus.  Neck: Normal range of motion. Neck supple.   Cardiovascular: Normal rate and regular rhythm.  Exam reveals no gallop and no friction rub.   No murmur heard. Pulmonary/Chest: Effort normal and breath sounds normal. No respiratory distress. She has no wheezes. She has no rales.  Abdominal: Soft. Bowel sounds are normal. She exhibits no distension and no mass. There is no tenderness. There is no rebound and no guarding.  Musculoskeletal: Normal range of motion. She exhibits no edema.  Neurological: She is alert and oriented to person, place, and time. No cranial nerve deficit.  Skin: Skin is warm and dry. No erythema.  Psychiatric: She has a normal mood and affect. Her behavior is normal. Judgment normal.   Female chaperone present for pelvic and breast  portions of the physical exam  Assessment: 71 y.o. G2P2 female with Stage 2 pelvic organ prolapse. She has a cystocele, rectocele, and apical prolapse.    Plan: Problem List Items Addressed This Visit    Prolapse of female pelvic organs   Cystocele, midline   Rectocele   Prolapse of vaginal vault after hysterectomy     I have reviewed the findings with the patient and discussed the risks of prolapse. She does not appear to have voiding dysfunction or frequent UTIs.  She does not appear to have bowel dysfunction.  She is not in pain and is only occasionally uncomfortable. She denies urinary and fecal incontinence.  Discussed observation, management with a pessary, and surgical management. She elects observation at this time and accepts reassurance.  I gave her precautions regarding urinary retention, bowel dysfunction, and worsening pain with prolapse.  She will follow up, as needed.   30 minutes spent in face to face discussion with > 50% spent in counseling and management of her prolapse of her vaginal vault after hysterectomy.   Prentice Docker, MD 06/04/2017 1:35 PM

## 2017-06-01 NOTE — ED Triage Notes (Signed)
Pt reports problems with her female organs, feels like something is falling out.

## 2017-06-04 DIAGNOSIS — N819 Female genital prolapse, unspecified: Secondary | ICD-10-CM | POA: Insufficient documentation

## 2017-06-04 DIAGNOSIS — N993 Prolapse of vaginal vault after hysterectomy: Secondary | ICD-10-CM | POA: Insufficient documentation

## 2017-06-04 DIAGNOSIS — N816 Rectocele: Secondary | ICD-10-CM | POA: Insufficient documentation

## 2017-06-04 DIAGNOSIS — N8111 Cystocele, midline: Secondary | ICD-10-CM | POA: Insufficient documentation

## 2017-06-09 ENCOUNTER — Emergency Department
Admission: EM | Admit: 2017-06-09 | Discharge: 2017-06-09 | Disposition: A | Discharge status: Home / Self Care | Payer: Medicare HMO | Attending: Emergency Medicine | Admitting: Emergency Medicine

## 2017-06-09 ENCOUNTER — Emergency Department: Payer: Medicare HMO

## 2017-06-09 ENCOUNTER — Encounter: Payer: Self-pay | Admitting: Emergency Medicine

## 2017-06-09 DIAGNOSIS — R110 Nausea: Secondary | ICD-10-CM | POA: Diagnosis not present

## 2017-06-09 DIAGNOSIS — R1084 Generalized abdominal pain: Secondary | ICD-10-CM | POA: Diagnosis not present

## 2017-06-09 DIAGNOSIS — Z79899 Other long term (current) drug therapy: Secondary | ICD-10-CM | POA: Insufficient documentation

## 2017-06-09 DIAGNOSIS — R195 Other fecal abnormalities: Secondary | ICD-10-CM | POA: Insufficient documentation

## 2017-06-09 LAB — COMPREHENSIVE METABOLIC PANEL
ALBUMIN: 4.4 g/dL (ref 3.5–5.0)
ALK PHOS: 52 U/L (ref 38–126)
ALT: 15 U/L (ref 14–54)
AST: 19 U/L (ref 15–41)
Anion gap: 9 (ref 5–15)
BUN: 11 mg/dL (ref 6–20)
CALCIUM: 9.6 mg/dL (ref 8.9–10.3)
CO2: 25 mmol/L (ref 22–32)
CREATININE: 0.85 mg/dL (ref 0.44–1.00)
Chloride: 105 mmol/L (ref 101–111)
GFR calc Af Amer: >60 mL/min (ref >60)
GFR calc non Af Amer: >60 mL/min (ref >60)
GLUCOSE: 102 mg/dL — AB (ref 65–99)
Potassium: 4 mmol/L (ref 3.5–5.1)
Sodium: 139 mmol/L (ref 135–145)
Total Bilirubin: 0.4 mg/dL (ref 0.3–1.2)
Total Protein: 8.1 g/dL (ref 6.5–8.1)

## 2017-06-09 LAB — URINALYSIS, COMPLETE (UACMP) WITH MICROSCOPIC
Bilirubin Urine: NEGATIVE
GLUCOSE, UA: NEGATIVE mg/dL
Hgb urine dipstick: NEGATIVE
Ketones, ur: NEGATIVE mg/dL
Nitrite: NEGATIVE
PH: 6 (ref 5.0–8.0)
Protein, ur: NEGATIVE mg/dL
Specific Gravity, Urine: 1.006 (ref 1.005–1.030)

## 2017-06-09 LAB — CBC
HCT: 39 % (ref 35.0–47.0)
Hemoglobin: 13.4 g/dL (ref 12.0–16.0)
MCH: 29.9 pg (ref 26.0–34.0)
MCHC: 34.3 g/dL (ref 32.0–36.0)
MCV: 87.2 fL (ref 80.0–100.0)
PLATELETS: 249 10*3/uL (ref 150–440)
RBC: 4.48 MIL/uL (ref 3.80–5.20)
RDW: 13.1 % (ref 11.5–14.5)
WBC: 6.8 10*3/uL (ref 3.6–11.0)

## 2017-06-09 LAB — LIPASE, BLOOD: Lipase: 24 U/L (ref 11–51)

## 2017-06-09 MED ORDER — IOPAMIDOL (ISOVUE-300) INJECTION 61%
100.0000 mL | Freq: Once | INTRAVENOUS | Status: AC | PRN
  Administered 2017-06-09: 100 mL via INTRAVENOUS
  Filled 2017-06-09: qty 100

## 2017-06-09 MED ORDER — MORPHINE SULFATE (PF) 4 MG/ML IV SOLN
4.0000 mg | Freq: Once | INTRAVENOUS | Status: AC
  Administered 2017-06-09: 4 mg via INTRAVENOUS
  Filled 2017-06-09: qty 1

## 2017-06-09 MED ORDER — OXYCODONE-ACETAMINOPHEN 5-325 MG PO TABS: 1.0000 | ORAL_TABLET | Freq: Four times a day (QID) | ORAL | 0 refills | Status: DC | PRN

## 2017-06-09 MED ORDER — ONDANSETRON HCL 4 MG/2ML IJ SOLN
4.0000 mg | Freq: Once | INTRAMUSCULAR | Status: AC
  Administered 2017-06-09: 4 mg via INTRAVENOUS
  Filled 2017-06-09: qty 2

## 2017-06-09 NOTE — Discharge Instructions (Signed)
Please take your pain medication only as needed for severe pain and make an appointment to follow-up with your primary care physician in 2 days for reexamination. Return to the emergency department sooner for any new or worsening symptoms such as fevers, chills, if you cannot eat or drink, for worsening pain, or for any other concerns whatsoever.  It was a pleasure to take care of you today, and thank you for coming to our emergency department.  If you have any questions or concerns before leaving please ask the nurse to grab me and I'm more than happy to go through your aftercare instructions again.  If you were prescribed any opioid pain medication today such as Norco, Vicodin, Percocet, morphine, hydrocodone, or oxycodone please make sure you do not drive when you are taking this medication as it can alter your ability to drive safely.  If you have any concerns once you are home that you are not improving or are in fact getting worse before you can make it to your follow-up appointment, please do not hesitate to call 911 and come back for further evaluation.  Darel Hong, MD  Results for orders placed or performed during the hospital encounter of 06/09/17  Lipase, blood  Result Value Ref Range   Lipase 24 11 - 51 U/L  Comprehensive metabolic panel  Result Value Ref Range   Sodium 139 135 - 145 mmol/L   Potassium 4.0 3.5 - 5.1 mmol/L   Chloride 105 101 - 111 mmol/L   CO2 25 22 - 32 mmol/L   Glucose, Bld 102 (H) 65 - 99 mg/dL   BUN 11 6 - 20 mg/dL   Creatinine, Ser 0.85 0.44 - 1.00 mg/dL   Calcium 9.6 8.9 - 10.3 mg/dL   Total Protein 8.1 6.5 - 8.1 g/dL   Albumin 4.4 3.5 - 5.0 g/dL   AST 19 15 - 41 U/L   ALT 15 14 - 54 U/L   Alkaline Phosphatase 52 38 - 126 U/L   Total Bilirubin 0.4 0.3 - 1.2 mg/dL   GFR calc non Af Amer >60 >60 mL/min   GFR calc Af Amer >60 >60 mL/min   Anion gap 9 5 - 15  CBC  Result Value Ref Range   WBC 6.8 3.6 - 11.0 K/uL   RBC 4.48 3.80 - 5.20 MIL/uL   Hemoglobin 13.4 12.0 - 16.0 g/dL   HCT 39.0 35.0 - 47.0 %   MCV 87.2 80.0 - 100.0 fL   MCH 29.9 26.0 - 34.0 pg   MCHC 34.3 32.0 - 36.0 g/dL   RDW 13.1 11.5 - 14.5 %   Platelets 249 150 - 440 K/uL  Urinalysis, Complete w Microscopic  Result Value Ref Range   Color, Urine YELLOW (A) YELLOW   APPearance CLEAR (A) CLEAR   Specific Gravity, Urine 1.006 1.005 - 1.030   pH 6.0 5.0 - 8.0   Glucose, UA NEGATIVE NEGATIVE mg/dL   Hgb urine dipstick NEGATIVE NEGATIVE   Bilirubin Urine NEGATIVE NEGATIVE   Ketones, ur NEGATIVE NEGATIVE mg/dL   Protein, ur NEGATIVE NEGATIVE mg/dL   Nitrite NEGATIVE NEGATIVE   Leukocytes, UA SMALL (A) NEGATIVE   RBC / HPF 0-5 0 - 5 RBC/hpf   WBC, UA 0-5 0 - 5 WBC/hpf   Bacteria, UA RARE (A) NONE SEEN   Squamous Epithelial / LPF 0-5 (A) NONE SEEN   Ct Abdomen Pelvis W Contrast  Result Date: 06/09/2017 CLINICAL DATA:  Left and right-sided abdominal pain EXAM: CT  ABDOMEN AND PELVIS WITH CONTRAST TECHNIQUE: Multidetector CT imaging of the abdomen and pelvis was performed using the standard protocol following bolus administration of intravenous contrast. CONTRAST:  170mL ISOVUE-300 IOPAMIDOL (ISOVUE-300) INJECTION 61% COMPARISON:  02/29/2016, 04/14/2014 FINDINGS: Lower chest: No acute abnormality. Hepatobiliary: No focal liver abnormality is seen. No gallstones, gallbladder wall thickening, or biliary dilatation. Pancreas: Unremarkable. No pancreatic ductal dilatation or surrounding inflammatory changes. Spleen: Normal in size without focal abnormality. Adrenals/Urinary Tract: Right adrenal gland is normal. 16 mm left adrenal gland nodule consistent with adenoma. No focal parenchymal abnormality of the kidneys. No hydronephrosis. Bladder within normal limits Stomach/Bowel: Stomach is within normal limits. Appendix appears normal. No evidence of bowel wall thickening, distention, or inflammatory changes. Scattered sigmoid colon diverticula but no acute inflammation.  Vascular/Lymphatic: Aortic atherosclerosis. No enlarged abdominal or pelvic lymph nodes. Reproductive: Status post hysterectomy. No adnexal masses. Other: Negative for free air or free fluid. Musculoskeletal: Degenerative changes. 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 No CT evidence for acute intra-abdominal or pelvic pathology 2. Stable 16 mm left adrenal gland nodule consistent with adenoma 3. 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 4. Sigmoid colon diverticula without acute inflammation Electronically Signed   By: Donavan Foil M.D.   On: 06/09/2017 18:20

## 2017-06-09 NOTE — ED Triage Notes (Signed)
Pt c/o pain to right and left side. When asked pt to point points to upper pelvic/lower abdominal area.  Denies NVD, fevers.  Pain for months but worst today per pt.  Gets seen for neck and back pain.  No urinary sx. Ambulatory to triage without difficulty.

## 2017-06-09 NOTE — ED Provider Notes (Signed)
[facility] Emergency Department Provider Note  ____________________________________________   First MD Initiated Contact with Patient 06/09/17 1654     (approximate)  I have reviewed the triage vital signs and the nursing notes.   HISTORY  Chief Complaint Abdominal Pain    HPI Janet Scott is a 71 y.o. female history of presents to the emergency Department with roughly 24 hours of left-sided greater than right-sided moderate to severe intermittent cramping colicky abdominal pain. She's had several loose stools recently but does not describe it as diarrhea. She occasionally has constipation. She has had some nausea but no vomiting. No fevers or chills. Her last bowel movement was this morning. She has a remote abdominal hysterectomy.   Past Medical History:  Diagnosis Date  . Leiomyoma 1993  . Menorrhagia     Patient Active Problem List   Diagnosis Date Noted  . Prolapse of female pelvic organs 06/04/2017  . Cystocele, midline 06/04/2017  . Rectocele 06/04/2017  . Prolapse of vaginal vault after hysterectomy 06/04/2017    Past Surgical History:  Procedure Laterality Date  . ABDOMINAL HYSTERECTOMY  02/1992   Total  ([facility])  . DILATION AND CURETTAGE OF UTERUS  1983    Prior to Admission medications   Medication Sig Start Date End Date Taking? Authorizing Provider  ibuprofen (ADVIL,MOTRIN) 600 MG tablet Take 1 tablet (600 mg total) by mouth every 6 (six) hours as needed. 01/01/17   Melynda Ripple, MD  methylPREDNISolone (MEDROL DOSEPAK) 4 MG TBPK tablet Take Tapered dose as directed 04/07/17   Sable Feil, PA-C  oxyCODONE-acetaminophen (ROXICET) 5-325 MG tablet Take 1 tablet by mouth every 6 (six) hours as needed for severe pain. 06/09/17   Darel Hong, MD  traMADol (ULTRAM) 50 MG tablet Take 1 tablet (50 mg total) by mouth every 6 (six) hours as needed. 01/01/17   Melynda Ripple, MD    Allergies Lodine [etodolac]  History  reviewed. No pertinent family history.  Social History Social History  Substance Use Topics  . Smoking status: Never Smoker  . Smokeless tobacco: Never Used  . Alcohol use No    Review of Systems Constitutional: No fever/chills Eyes: No visual changes. ENT: No sore throat. Cardiovascular: Denies chest pain. Respiratory: Denies shortness of breath. Gastrointestinal: positive abdominal pain.  positivenausea, no vomiting.  No diarrhea.  No constipation. Genitourinary: Negative for dysuria. Musculoskeletal: Negative for back pain. Skin: Negative for rash. Neurological: Negative for headaches, focal weakness or numbness.   ____________________________________________   PHYSICAL EXAM:  VITAL SIGNS: ED Triage Vitals [06/09/17 1408]  Enc Vitals Group     BP (!) 157/76     Pulse Rate 92     Resp 18     Temp 98.6 F (37 C)     Temp Source Oral     SpO2 99 %     Weight 170 lb (77.1 kg)     Height 5\' 2"  (1.575 m)     Head Circumference      Peak Flow      Pain Score 8     Pain Loc      Pain Edu?      Excl. in [place]?     Constitutional: alert and oriented 4 appears extremely uncomfortable pacing around the room no diaphoresis Eyes: PERRL EOMI. Head: Atraumatic. Nose: No congestion/rhinnorhea. Mouth/Throat: No trismus Neck: No stridor.   Cardiovascular: Normal rate, regular rhythm. Grossly normal heart sounds.  Good peripheral circulation. Respiratory: Normal respiratory effort.  No  retractions. Lungs CTAB and moving good air Gastrointestinal: soft nondistended some tenderness left lower quadrant greater than right lower quadrant although with no frank peritonitis Musculoskeletal: No lower extremity edema   Neurologic:  Normal speech and language. No gross focal neurologic deficits are appreciated. Skin:  Skin is warm, dry and intact. No rash noted. Psychiatric: Mood and affect are normal. Speech and behavior are  normal.    ____________________________________________   DIFFERENTIAL includes but not limited to  appendicitis, diverticulitis, volvulus, small bowel obstruction, large bowel obstruction, pyelonephritis, renal colic ____________________________________________   LABS (all labs ordered are listed, but only abnormal results are displayed)  Labs Reviewed  COMPREHENSIVE METABOLIC PANEL - Abnormal; Notable for the following:       Result Value   Glucose, Bld 102 (*)    All other components within normal limits  URINALYSIS, COMPLETE (UACMP) WITH MICROSCOPIC - Abnormal; Notable for the following:    Color, Urine YELLOW (*)    APPearance CLEAR (*)    Leukocytes, UA SMALL (*)    Bacteria, UA RARE (*)    Squamous Epithelial / LPF 0-5 (*)    All other components within normal limits  LIPASE, BLOOD  CBC    blood work unremarkable no signs of urinary tract infection __________________________________________  EKG   ____________________________________________  RADIOLOGY  CT scan reviewed by me no acute disease noted in the abdomen or pelvis ____________________________________________   PROCEDURES  Procedure(s) performed: no  Procedures  Critical Care performed: no  Observation: no ____________________________________________   INITIAL IMPRESSION / ASSESSMENT AND PLAN / ED COURSE  Pertinent labs & imaging results that were available during my care of the patient were reviewed by me and considered in my medical decision making (see chart for details).  The patient arrives uncomfortable appearing. She does have some left lower quadrant tenderness along with intermittent loose stools which raises the concern for diverticulitis. CT scan is pending.    The patient's CT scan is negative for acute intra-abdominal pathology. It does show diverticulosis but no diverticulitis. The patient's pain is markedly improved. At a lengthy discussion with the patient regarding her  symptoms and that she needs to follow up with her primary care physician within 2 days for reexamination. Strict return precautions given. She is discharged home in improved condition.  ____________________________________________   FINAL CLINICAL IMPRESSION(S) / ED DIAGNOSES  Final diagnoses:  Generalized abdominal pain      NEW MEDICATIONS STARTED DURING THIS VISIT:  Discharge Medication List as of 06/09/2017  6:33 PM    START taking these medications   Details  oxyCODONE-acetaminophen (ROXICET) 5-325 MG tablet Take 1 tablet by mouth every 6 (six) hours as needed for severe pain., Starting Tue 06/09/2017, Print         Note:  This document was prepared using Dragon voice recognition software and may include unintentional dictation errors.     Darel Hong, MD 06/09/17 [phone]

## 2017-06-11 ENCOUNTER — Ambulatory Visit: Payer: Self-pay | Admitting: Obstetrics & Gynecology

## 2017-07-01 ENCOUNTER — Encounter: Payer: Self-pay | Admitting: Obstetrics and Gynecology

## 2017-10-21 ENCOUNTER — Other Ambulatory Visit: Payer: Self-pay | Admitting: Family Medicine

## 2017-10-21 DIAGNOSIS — Z1231 Encounter for screening mammogram for malignant neoplasm of breast: Secondary | ICD-10-CM

## 2017-10-21 DIAGNOSIS — Z1239 Encounter for other screening for malignant neoplasm of breast: Secondary | ICD-10-CM

## 2017-10-28 ENCOUNTER — Other Ambulatory Visit: Payer: Self-pay | Admitting: Family Medicine

## 2017-10-28 ENCOUNTER — Ambulatory Visit
Admission: RE | Admit: 2017-10-28 | Discharge: 2017-10-28 | Disposition: A | Discharge status: Home / Self Care | Payer: Medicare HMO | Source: Ambulatory Visit | Attending: Family Medicine | Admitting: Family Medicine

## 2017-10-28 DIAGNOSIS — Z1231 Encounter for screening mammogram for malignant neoplasm of breast: Secondary | ICD-10-CM

## 2017-10-28 DIAGNOSIS — Z1239 Encounter for other screening for malignant neoplasm of breast: Secondary | ICD-10-CM

## 2019-12-23 ENCOUNTER — Encounter: Payer: Self-pay | Admitting: Emergency Medicine

## 2019-12-23 ENCOUNTER — Other Ambulatory Visit: Payer: Self-pay

## 2019-12-23 ENCOUNTER — Ambulatory Visit
Admission: EM | Admit: 2019-12-23 | Discharge: 2019-12-23 | Disposition: A | Discharge status: Home / Self Care | Payer: Medicare HMO | Attending: Family Medicine | Admitting: Family Medicine

## 2019-12-23 ENCOUNTER — Ambulatory Visit (INDEPENDENT_AMBULATORY_CARE_PROVIDER_SITE_OTHER): Payer: Medicare HMO

## 2019-12-23 DIAGNOSIS — M545 Low back pain: Secondary | ICD-10-CM | POA: Diagnosis not present

## 2019-12-23 DIAGNOSIS — M5137 Other intervertebral disc degeneration, lumbosacral region: Secondary | ICD-10-CM

## 2019-12-23 DIAGNOSIS — M25552 Pain in left hip: Secondary | ICD-10-CM | POA: Diagnosis not present

## 2019-12-23 HISTORY — DX: Other seasonal allergic rhinitis: J30.2

## 2019-12-23 MED ORDER — TRAMADOL HCL 50 MG PO TABS: 50.0000 mg | ORAL_TABLET | Freq: Three times a day (TID) | ORAL | 0 refills | Status: DC | PRN

## 2019-12-23 NOTE — ED Provider Notes (Signed)
[facility]    CSN: [id number] Arrival date & time: 12/23/19  [id number]      History   Chief Complaint Chief Complaint  Patient presents with  . Hip Pain    left   HPI  74 year old female presents with hip pain.  Patient reports 6 to 8 weeks of left hip pain.  Patient localizes the pain to the posterior hip/buttocks.  Has been fairly constant over this period of time.  She states that she was having difficulty sleeping last night.  She has tried some ice and topical agent without relief.  She states that she has had an increase in physical activity recently.  Patient also endorses back pain.  No relieving factors.  No reported fall, trauma, injury.  No other associated symptoms.  No other complaints.    Past Medical History:  Diagnosis Date  . Leiomyoma 1993  . Menorrhagia   . Seasonal allergies     Patient Active Problem List   Diagnosis Date Noted  . Prolapse of female pelvic organs 06/04/2017  . Cystocele, midline 06/04/2017  . Rectocele 06/04/2017  . Prolapse of vaginal vault after hysterectomy 06/04/2017    Past Surgical History:  Procedure Laterality Date  . ABDOMINAL HYSTERECTOMY  02/1992   Total  ([facility])  . CERVICAL SPINE SURGERY    . DILATION AND CURETTAGE OF UTERUS  1983    OB History    Gravida  2   Para  2   Term      Preterm      AB      Living  2     SAB      TAB      Ectopic      Multiple      Live Births               Home Medications    Prior to Admission medications   Medication Sig Start Date End Date Taking? Authorizing Provider  cetirizine (ZYRTEC) 10 MG tablet Take 1 tablet by mouth daily.    [provider]  traMADol (ULTRAM) 50 MG tablet Take 1 tablet (50 mg total) by mouth every 8 (eight) hours as needed for moderate pain or severe pain. 12/23/19   Coral Spikes, DO    Family History Family History  Problem Relation Age of Onset  . Other Father        "stomach flu"  . Cancer Father    oral  . Other Brother        suicide  . Breast cancer Neg Hx     Social History Social History   Tobacco Use  . Smoking status: Never Smoker  . Smokeless tobacco: Never Used  Substance Use Topics  . Alcohol use: No  . Drug use: No     Allergies   Etodolac   Review of Systems Review of Systems  Constitutional: Negative.   Musculoskeletal: Positive for back pain.       Left hip pain.   Physical Exam Triage Vital Signs ED Triage Vitals  Enc Vitals Group     BP 12/23/19 0922 (!) 168/78     Pulse Rate 12/23/19 0922 78     Resp 12/23/19 0922 18     Temp 12/23/19 0922 98.1 F (36.7 C)     Temp Source 12/23/19 0922 Oral     SpO2 12/23/19 0922 100 %     Weight 12/23/19 0923 165 lb (74.8 kg)     Height  12/23/19 0923 5\' 2"  (1.575 m)     Head Circumference --      Peak Flow --      Pain Score 12/23/19 0923 7     Pain Loc --      Pain Edu? --      Excl. in [place]? --    Updated Vital Signs BP (!) 168/78 (BP Location: Left Arm)   Pulse 78   Temp 98.1 F (36.7 C) (Oral)   Resp 18   Ht 5\' 2"  (1.575 m)   Wt 74.8 kg   SpO2 100%   BMI 30.18 kg/m   Visual Acuity Right Eye Distance:   Left Eye Distance:   Bilateral Distance:    Right Eye Near:   Left Eye Near:    Bilateral Near:     Physical Exam Vitals and nursing note reviewed.  Constitutional:      General: She is not in acute distress.    Appearance: Normal appearance. She is not ill-appearing.  HENT:     Head: Normocephalic and atraumatic.  Eyes:     General:        Right eye: No discharge.        Left eye: No discharge.     Conjunctiva/sclera: Conjunctivae normal.  Cardiovascular:     Rate and Rhythm: Normal rate and regular rhythm.  Pulmonary:     Effort: Pulmonary effort is normal. No respiratory distress.  Musculoskeletal:     Comments: Hip: Left Full ROM Pelvic alignment unremarkable to inspection and palpation. Greater trochanter without tenderness to palpation. No pain with FABER or  FADIR.   Neurological:     Mental Status: She is alert.  Psychiatric:        Mood and Affect: Mood normal.        Behavior: Behavior normal.    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 Mild dextrocurvature. Numbering is based on the lowest ribs. Advanced degenerative disc narrowing from L4-5 S1-2. IMPRESSION: 1. Transitional S1 vertebra. 2. 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 3. L4-5 to S1-2 advanced degenerative disc disease. 4. Mild dextroscoliosis. Electronically Signed   By: Monte Fantasia M.D.   On: 12/23/2019 10:05   DG Hip Unilat W or Wo Pelvis 2-3 Views Left  Result Date: 12/23/2019 CLINICAL DATA:  Left hip and leg pain. EXAM: DG HIP (WITH OR WITHOUT PELVIS) 2-3V LEFT COMPARISON:  CT abdomen pelvis dated June 09, 2017. FINDINGS: There is no evidence of hip fracture or dislocation. There is no evidence of arthropathy or other focal bone abnormality. Osteopenia. Lower lumbar spondylosis. IMPRESSION: 1. No acute osseous abnormality or significant degenerative changes of the left hip. Electronically Signed   By: Titus Dubin M.D.   On: 12/23/2019 10:06    Procedures Procedures (including critical care time)  Medications Ordered in UC Medications - No data to display  Initial Impression / Assessment and Plan / UC Course  I have reviewed the triage vital signs and the nursing notes.  Pertinent labs & imaging results that were available during my care of the patient were reviewed by me and considered  in my medical decision making (see chart for details).    74 year old female  presents with reported hip pain and back pain.  X-ray of the hip obtained and was independently reviewed by me.  Interpretation: No evidence of fracture. There is no evidence of osteoarthritis of the hip.  X-ray of the lumbar spine was obtained and revealed degenerative changes.  Advise rest.  Tylenol as needed.  Tramadol as needed for pain.  Supportive care.  Final Clinical Impressions(s) / UC Diagnoses   Final diagnoses:  DDD (degenerative disc disease), lumbosacral     Discharge Instructions     Rest.  Tylenol 1000 mg TID as needed for pain.  Take prescribed pain medication as directed.  Take care  Dr. Lacinda Axon     ED Prescriptions    Medication Sig Dispense Auth. Provider   traMADol (ULTRAM) 50 MG tablet Take 1 tablet (50 mg total) by mouth every 8 (eight) hours as needed for moderate pain or severe pain. 15 tablet Thersa Salt G, DO     I have reviewed the PDMP during this encounter.   Coral Spikes, DO 12/23/19 1106

## 2019-12-23 NOTE — Discharge Instructions (Signed)
Rest.  Tylenol 1000 mg TID as needed for pain.  Take prescribed pain medication as directed.  Take care  Dr. Lacinda Axon

## 2019-12-23 NOTE — ED Triage Notes (Signed)
Patient in today c/o left hip pain x 6-8 weeks.Patient states she has been more active outside recently. Patient states that she was having more trouble trying to sleep last night with the hip pain and thought she better come in for evaluation. Patient has tried ice and rub on hemp.

## 2020-04-02 ENCOUNTER — Other Ambulatory Visit: Payer: Self-pay | Admitting: Family Medicine

## 2020-04-02 DIAGNOSIS — Z1231 Encounter for screening mammogram for malignant neoplasm of breast: Secondary | ICD-10-CM

## 2020-04-03 ENCOUNTER — Other Ambulatory Visit: Payer: Self-pay

## 2020-04-03 ENCOUNTER — Ambulatory Visit
Admission: RE | Admit: 2020-04-03 | Discharge: 2020-04-03 | Disposition: A | Discharge status: Home / Self Care | Payer: Medicare HMO | Source: Ambulatory Visit | Attending: Family Medicine | Admitting: Family Medicine

## 2020-04-03 DIAGNOSIS — Z1231 Encounter for screening mammogram for malignant neoplasm of breast: Secondary | ICD-10-CM

## 2020-04-04 ENCOUNTER — Other Ambulatory Visit: Payer: Self-pay | Admitting: Family Medicine

## 2020-04-10 ENCOUNTER — Other Ambulatory Visit: Payer: Self-pay | Admitting: Family Medicine

## 2020-04-10 DIAGNOSIS — R928 Other abnormal and inconclusive findings on diagnostic imaging of breast: Secondary | ICD-10-CM

## 2020-04-10 DIAGNOSIS — N6310 Unspecified lump in the right breast, unspecified quadrant: Secondary | ICD-10-CM

## 2020-04-16 ENCOUNTER — Ambulatory Visit
Admission: RE | Admit: 2020-04-16 | Discharge: 2020-04-16 | Disposition: A | Discharge status: Home / Self Care | Payer: Medicare HMO | Source: Ambulatory Visit | Attending: Family Medicine | Admitting: Family Medicine

## 2020-04-16 DIAGNOSIS — R928 Other abnormal and inconclusive findings on diagnostic imaging of breast: Secondary | ICD-10-CM | POA: Diagnosis present

## 2020-04-16 DIAGNOSIS — N6310 Unspecified lump in the right breast, unspecified quadrant: Secondary | ICD-10-CM

## 2020-05-09 ENCOUNTER — Other Ambulatory Visit: Payer: Self-pay | Admitting: Family Medicine

## 2020-05-09 DIAGNOSIS — N6310 Unspecified lump in the right breast, unspecified quadrant: Secondary | ICD-10-CM

## 2020-10-18 ENCOUNTER — Ambulatory Visit
Admission: RE | Admit: 2020-10-18 | Discharge: 2020-10-18 | Disposition: A | Discharge status: Home / Self Care | Payer: Medicare HMO | Source: Ambulatory Visit | Attending: Family Medicine | Admitting: Family Medicine

## 2020-10-18 ENCOUNTER — Other Ambulatory Visit: Payer: Self-pay

## 2020-10-18 DIAGNOSIS — N6310 Unspecified lump in the right breast, unspecified quadrant: Secondary | ICD-10-CM

## 2020-10-18 DIAGNOSIS — N6001 Solitary cyst of right breast: Secondary | ICD-10-CM | POA: Diagnosis not present

## 2021-04-05 ENCOUNTER — Ambulatory Visit
Admission: EM | Admit: 2021-04-05 | Discharge: 2021-04-05 | Disposition: A | Discharge status: Home / Self Care | Payer: Medicare HMO | Attending: Family Medicine | Admitting: Family Medicine

## 2021-04-05 ENCOUNTER — Other Ambulatory Visit: Payer: Self-pay

## 2021-04-05 DIAGNOSIS — J069 Acute upper respiratory infection, unspecified: Secondary | ICD-10-CM | POA: Diagnosis not present

## 2021-04-05 DIAGNOSIS — J029 Acute pharyngitis, unspecified: Secondary | ICD-10-CM | POA: Diagnosis not present

## 2021-04-05 DIAGNOSIS — U071 COVID-19: Secondary | ICD-10-CM | POA: Diagnosis not present

## 2021-04-05 DIAGNOSIS — R059 Cough, unspecified: Secondary | ICD-10-CM | POA: Diagnosis present

## 2021-04-05 LAB — SARS CORONAVIRUS 2 (TAT 6-24 HRS): SARS Coronavirus 2: POSITIVE — AB

## 2021-04-05 MED ORDER — IPRATROPIUM BROMIDE 0.06 % NA SOLN: 2.0000 | Freq: Four times a day (QID) | NASAL | 12 refills | Status: DC

## 2021-04-05 NOTE — Discharge Instructions (Addendum)
Isolate at home pending the results of your COVID test.  If you test positive then you will have to quarantine for 5 days from the start of your symptoms.  After 5 days you can break quarantine if your symptoms have improved and you have not had a fever for 24 hours without taking Tylenol or ibuprofen.  Use over-the-counter Tylenol and ibuprofen as needed for body aches and fever.  Use the Atrovent nasal spray, 2 squirts in each nostril every 6 hours, as needed for nasal congestion and runny nose.  If you develop any increased shortness of breath-especially at rest, you are unable to speak in full sentences, or is a late sign your lips are turning blue you need to go the ER for evaluation.

## 2021-04-05 NOTE — ED Triage Notes (Signed)
Pt c/o body aches this past Wednesday, some nasal congestion, along with elevated temps (100.1 highest). Pt also has occasional cough. Pt reports she does have seasonal allergies and has been mowing her grass recently.

## 2021-04-05 NOTE — ED Provider Notes (Signed)
[facility]    CSN: [id number] Arrival date & time: 04/05/21  1148      History   Chief Complaint Chief Complaint  Patient presents with   Nasal Congestion   Cough    HPI Gal Smolinski is a 75 y.o. female.   HPI  75 year old female here for evaluation of respiratory complaints.  Patient reports that for the last 2 days she has been experiencing nasal congestion, sore throat, ear pressure, body aches, and intermittent nonproductive cough.  She denies any runny nose, shortness of breath or wheezing, GI complaints, or known COVID exposure.  She has had elevated temp to 100.1.  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 Authorizing Provider  ipratropium (ATROVENT) 0.06 % nasal spray Place 2 sprays into both nostrils 4 (four) times daily. 04/05/21  Yes Margarette Canada, NP  cetirizine (ZYRTEC) 10 MG tablet Take 1 tablet by mouth daily.    [provider]  traMADol (ULTRAM) 50 MG tablet Take 1 tablet (50 mg total) by mouth every 8 (eight) hours as needed for moderate pain or severe pain. 12/23/19   Coral Spikes, DO    Family History Family History  Problem Relation Age of Onset   Other Father        "stomach flu"   Cancer Father         oral   Other Brother        suicide   Breast cancer Neg Hx     Social History Social History   Tobacco Use   Smoking status: Never   Smokeless tobacco: Never  Vaping Use   Vaping Use: Never used  Substance Use Topics   Alcohol use: No   Drug use: No     Allergies   Etodolac   Review of Systems Review of Systems  Constitutional:  Positive for fever. Negative for activity change and appetite change.  HENT:  Positive for congestion, ear pain and sore throat. Negative for rhinorrhea.   Respiratory:  Positive for cough. Negative for shortness of breath and wheezing.   Gastrointestinal:  Negative for diarrhea, nausea and vomiting.  Skin:  Negative for rash.  Hematological: Negative.   Psychiatric/Behavioral: Negative.      Physical Exam Triage Vital Signs ED Triage Vitals  Enc Vitals Group     BP 04/05/21 1206 (!) 145/79     Pulse Rate  04/05/21 1206 83     Resp 04/05/21 1206 18     Temp 04/05/21 1206 98.4 F (36.9 C)     Temp Source 04/05/21 1206 Oral     SpO2 04/05/21 1206 97 %     Weight 04/05/21 1204 163 lb (73.9 kg)     Height 04/05/21 1204 5\' 2"  (1.575 m)     Head Circumference --      Peak Flow --      Pain Score 04/05/21 1204 0     Pain Loc --      Pain Edu? --      Excl. in [place]? --    No data found.  Updated Vital Signs BP (!) 145/79 (BP Location: Left Arm)   Pulse 83   Temp 98.4 F (36.9 C) (Oral)   Resp 18   Ht 5\' 2"  (1.575 m)   Wt 163 lb (73.9 kg)   SpO2 97%   BMI 29.81 kg/m   Visual Acuity Right Eye Distance:   Left Eye Distance:   Bilateral Distance:    Right Eye Near:   Left Eye Near:    Bilateral Near:     Physical Exam Vitals and nursing note reviewed.  Constitutional:      General: She is not in acute distress.    Appearance: Normal appearance. She is not ill-appearing.  HENT:     Head: Normocephalic and atraumatic.     Right Ear: Tympanic membrane, ear canal and external ear normal. There is no impacted cerumen.      Left Ear: Tympanic membrane, ear canal and external ear normal. There is no impacted cerumen.     Nose: Congestion and rhinorrhea present.     Mouth/Throat:     Mouth: Mucous membranes are moist.     Pharynx: Oropharynx is clear. No posterior oropharyngeal erythema.  Cardiovascular:     Rate and Rhythm: Normal rate and regular rhythm.     Pulses: Normal pulses.     Heart sounds: Normal heart sounds. No murmur heard.   No gallop.  Pulmonary:     Effort: Pulmonary effort is normal.     Breath sounds: Normal breath sounds. No wheezing, rhonchi or rales.  Musculoskeletal:     Cervical back: Normal range of motion and neck supple.  Lymphadenopathy:     Cervical: No cervical adenopathy.  Skin:    General: Skin is warm and dry.     Capillary Refill: Capillary refill takes less than 2 seconds.     Findings: No erythema.  Neurological:     General: No focal deficit present.     Mental Status: She is alert and oriented to person, place, and time.  Psychiatric:        Mood and Affect: Mood normal.        Behavior: Behavior normal.        Thought Content: Thought content normal.        Judgment: Judgment normal.     UC Treatments / Results  Labs (all labs ordered are listed, but only abnormal results are displayed) Labs Reviewed  SARS CORONAVIRUS 2 (TAT 6-24 HRS)    EKG   Radiology No results found.  Procedures Procedures (including critical care time)  Medications Ordered in UC Medications - No data to display  Initial Impression / Assessment and Plan / UC Course  I have reviewed the triage vital signs and the nursing notes.  Pertinent labs & imaging results that were available during my  care of the patient were reviewed by me and considered in my medical decision making (see chart for details).  Patient is a very pleasant and nontoxic-appearing 75 year old female here for evaluation of COVID-like symptoms as outlined in HPI above.  Patient's physical exam reveals pearly  gray tympanic membranes bilaterally with a normal light reflex and clear external auditory canals.  Nasal mucosa is erythematous and edematous with scant thick clear nasal discharge.  Oropharyngeal exam is benign.  No cervical lymphadenopathy appreciated on exam.  Cardiopulmonary exam reveals clear lung sounds in all lung fields.  Will swab patient for COVID and discharged home to isolate pending the results.  In the absence of a consistent cough I have advised the patient to use over-the-counter cough preparations as needed.  Will provide Atrovent nasal spray prescription to help with nasal congestion.  Supportive care.   Final Clinical Impressions(s) / UC Diagnoses   Final diagnoses:  Upper respiratory tract infection, unspecified type     Discharge Instructions      Isolate at home pending the results of your COVID test.  If you test positive then you will have to quarantine for 5 days from the start of your symptoms.  After 5 days you can break quarantine if your symptoms have improved and you have not had a fever for 24 hours without taking Tylenol or ibuprofen.  Use over-the-counter Tylenol and ibuprofen as needed for body aches and fever.  Use the Atrovent nasal spray, 2 squirts in each nostril every 6 hours, as needed for nasal congestion and runny nose.  If you develop any increased shortness of breath-especially at rest, you are unable to speak in full sentences, or is a late sign your lips are turning blue you need to go the ER for evaluation.      ED Prescriptions     Medication Sig Dispense Auth. Provider   ipratropium (ATROVENT) 0.06 % nasal spray Place 2 sprays into both nostrils 4 (four) times daily. 15 mL Margarette Canada, NP      PDMP not reviewed this encounter.   Margarette Canada, NP 04/05/21 1227

## 2021-06-18 DIAGNOSIS — Z23 Encounter for immunization: Secondary | ICD-10-CM | POA: Diagnosis not present

## 2021-06-18 DIAGNOSIS — Z0000 Encounter for general adult medical examination without abnormal findings: Secondary | ICD-10-CM | POA: Diagnosis not present

## 2021-06-25 DIAGNOSIS — I10 Essential (primary) hypertension: Secondary | ICD-10-CM | POA: Diagnosis not present

## 2021-09-06 DIAGNOSIS — Z0000 Encounter for general adult medical examination without abnormal findings: Secondary | ICD-10-CM | POA: Diagnosis not present

## 2021-09-06 DIAGNOSIS — Z0130 Encounter for examination of blood pressure without abnormal findings: Secondary | ICD-10-CM | POA: Diagnosis not present

## 2021-09-06 DIAGNOSIS — I10 Essential (primary) hypertension: Secondary | ICD-10-CM | POA: Diagnosis not present

## 2021-09-10 DIAGNOSIS — I10 Essential (primary) hypertension: Secondary | ICD-10-CM | POA: Diagnosis not present

## 2021-09-10 DIAGNOSIS — N3000 Acute cystitis without hematuria: Secondary | ICD-10-CM | POA: Diagnosis not present

## 2021-09-10 DIAGNOSIS — R002 Palpitations: Secondary | ICD-10-CM | POA: Diagnosis not present

## 2021-09-11 DIAGNOSIS — I10 Essential (primary) hypertension: Secondary | ICD-10-CM | POA: Diagnosis not present

## 2021-09-19 DIAGNOSIS — Z0130 Encounter for examination of blood pressure without abnormal findings: Secondary | ICD-10-CM | POA: Diagnosis not present

## 2021-09-19 DIAGNOSIS — N3000 Acute cystitis without hematuria: Secondary | ICD-10-CM | POA: Diagnosis not present

## 2021-09-19 DIAGNOSIS — I10 Essential (primary) hypertension: Secondary | ICD-10-CM | POA: Diagnosis not present

## 2021-09-19 DIAGNOSIS — Z0000 Encounter for general adult medical examination without abnormal findings: Secondary | ICD-10-CM | POA: Diagnosis not present

## 2021-10-31 IMAGING — MG DIGITAL SCREENING BILAT W/ TOMO W/ CAD
8 series · 8 of 24 positions shown · non-contrast
Comparison: Previous exam(s).

CLINICAL DATA: Screening.

EXAM:
DIGITAL SCREENING BILATERAL MAMMOGRAM WITH TOMO AND CAD

[R MLO synth-2D]
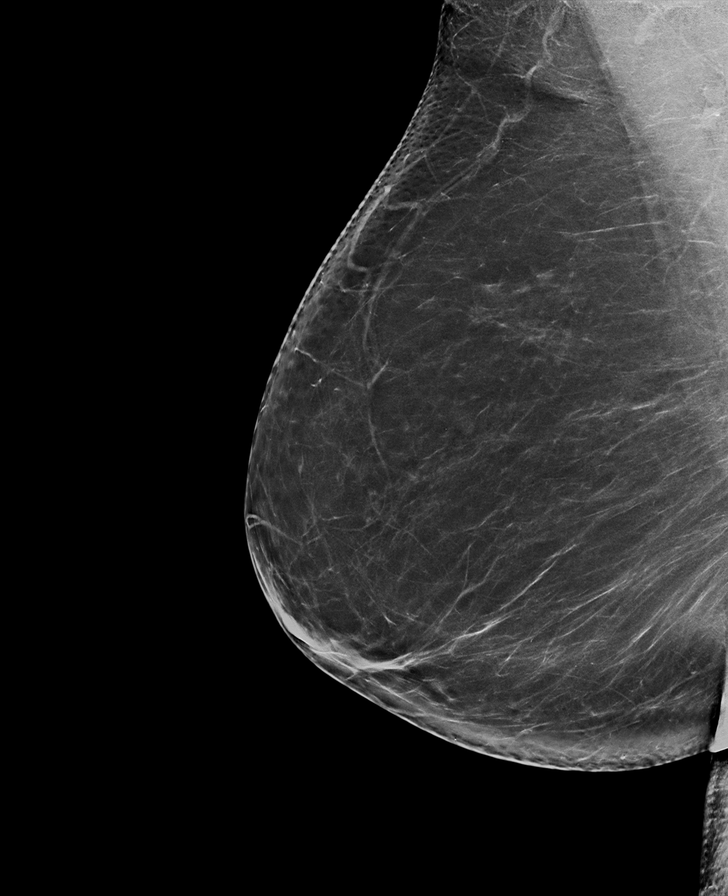

[L MLO synth-2D]
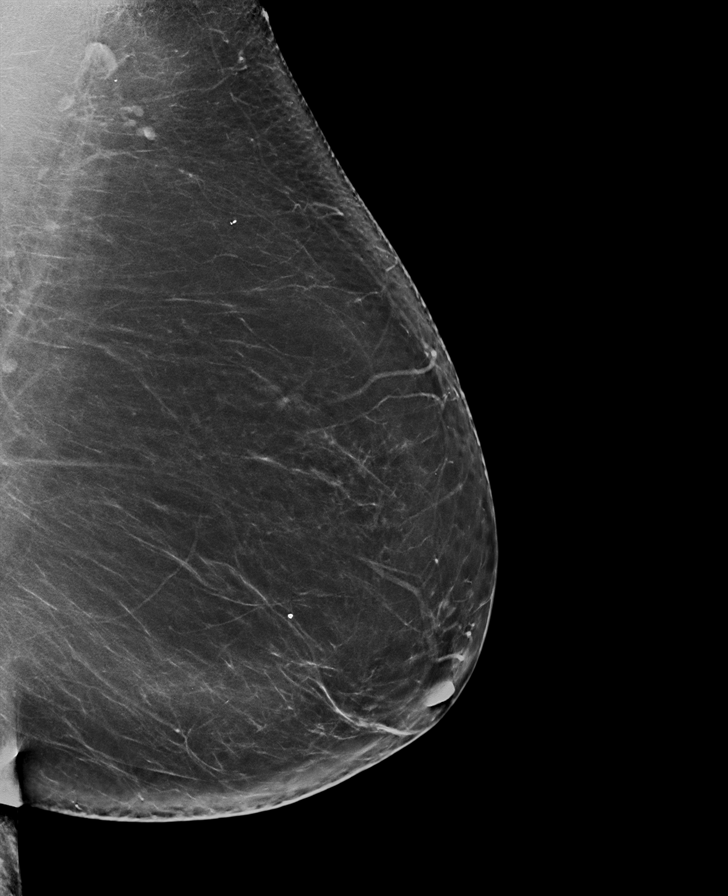

[L CC synth-2D]
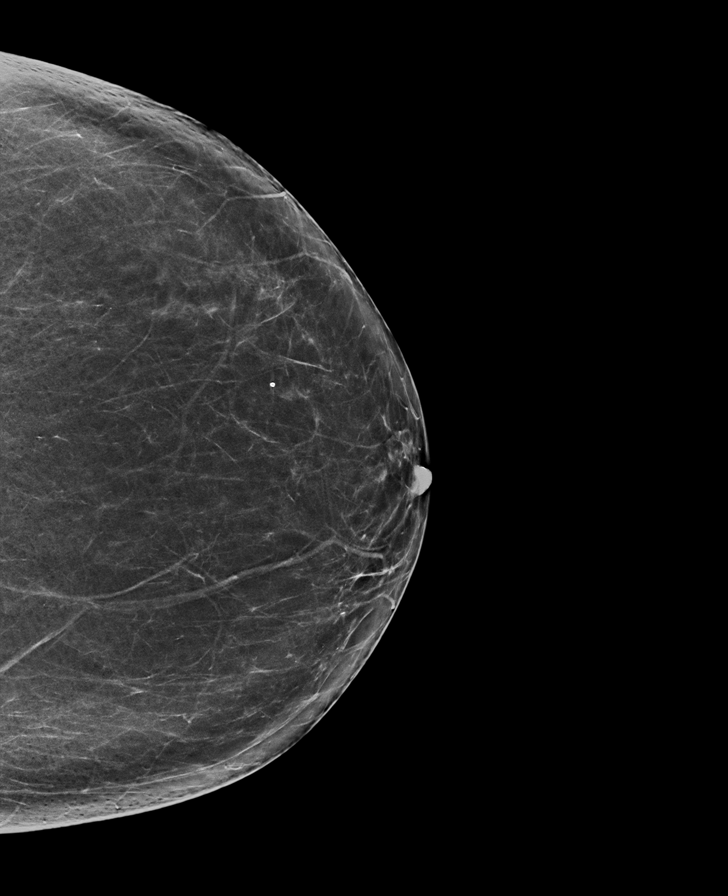

[R CC synth-2D]
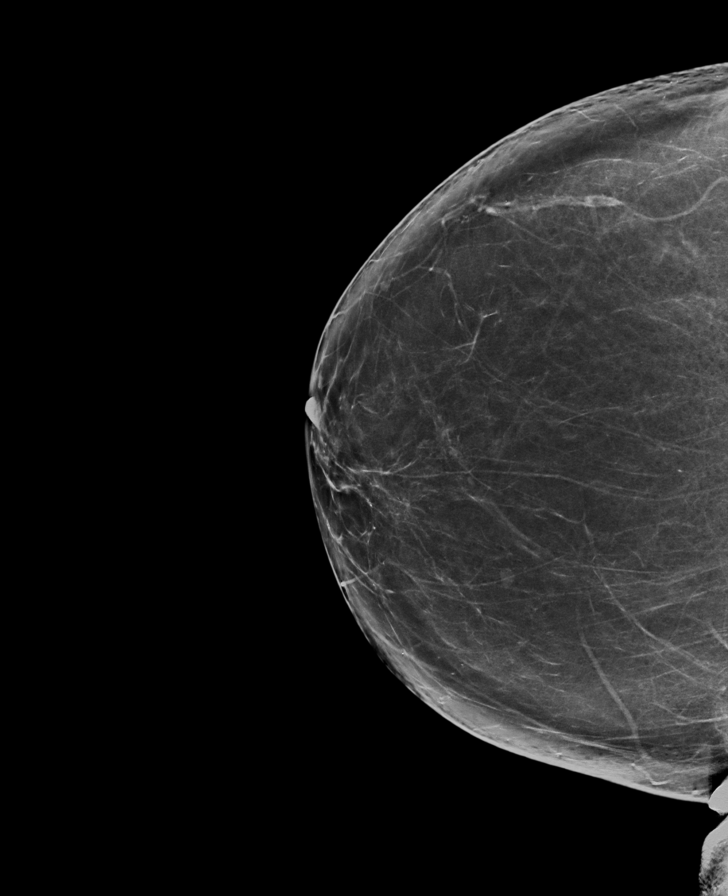

[L CC tomo · tomo slice 38/75.0]
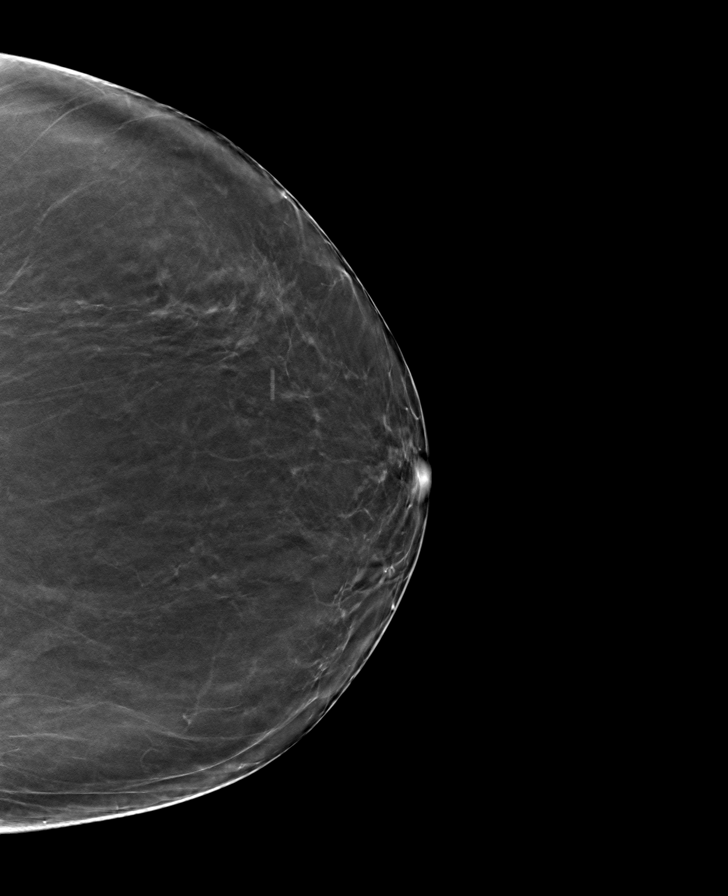

[R CC tomo · tomo slice 41/80.0]
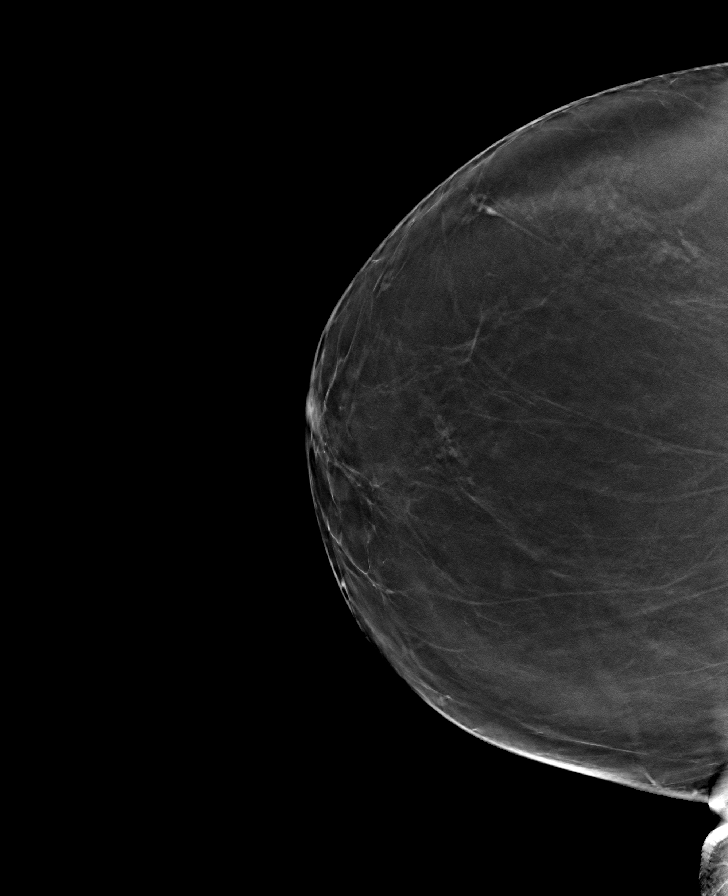

[R MLO tomo · tomo slice 43/85.0]
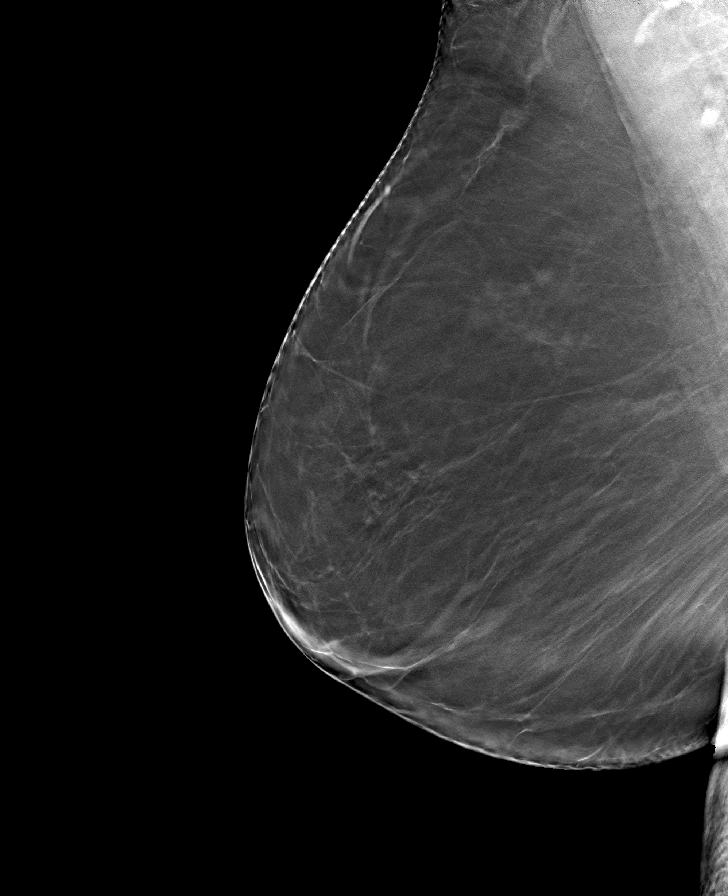

[L MLO tomo · tomo slice 44/87.0]
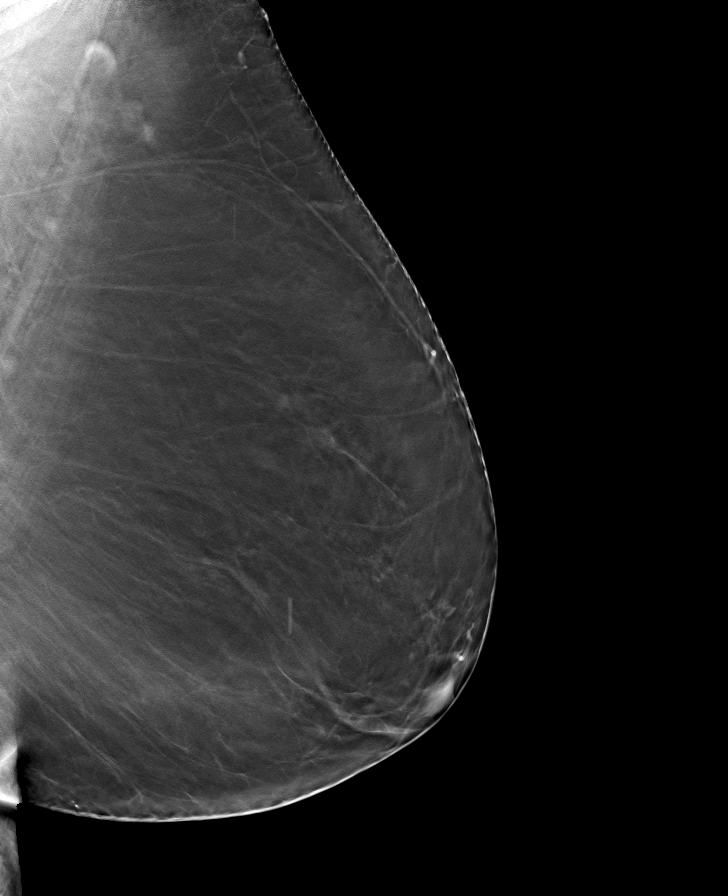

[8 of 24 positions shown; findings below may reference images not displayed]

ACR Breast Density Category b: There are scattered areas of
fibroglandular density.
FINDINGS: In the right breast, a possible mass warrants further evaluation. In
the left breast, no findings suspicious for malignancy. Images were
processed with CAD.
IMPRESSION: Further evaluation is suggested for possible mass in the right
breast.

RECOMMENDATION:
Diagnostic mammogram and possibly ultrasound of the right breast.
(Code:T1-A-550)

The patient will be contacted regarding the findings, and additional
imaging will be scheduled.

BI-RADS CATEGORY  0: Incomplete. Need additional imaging evaluation
and/or prior mammograms for comparison.

## 2021-11-06 DIAGNOSIS — N3090 Cystitis, unspecified without hematuria: Secondary | ICD-10-CM | POA: Diagnosis not present

## 2021-11-06 DIAGNOSIS — Z0130 Encounter for examination of blood pressure without abnormal findings: Secondary | ICD-10-CM | POA: Diagnosis not present

## 2021-11-06 DIAGNOSIS — I10 Essential (primary) hypertension: Secondary | ICD-10-CM | POA: Diagnosis not present

## 2021-11-06 DIAGNOSIS — Z0000 Encounter for general adult medical examination without abnormal findings: Secondary | ICD-10-CM | POA: Diagnosis not present

## 2021-11-11 ENCOUNTER — Ambulatory Visit
Admission: EM | Admit: 2021-11-11 | Discharge: 2021-11-11 | Disposition: A | Discharge status: Home / Self Care | Payer: Medicare HMO | Attending: Emergency Medicine | Admitting: Emergency Medicine

## 2021-11-11 ENCOUNTER — Other Ambulatory Visit: Payer: Self-pay

## 2021-11-11 DIAGNOSIS — Z711 Person with feared health complaint in whom no diagnosis is made: Secondary | ICD-10-CM | POA: Insufficient documentation

## 2021-11-11 LAB — URINALYSIS, ROUTINE W REFLEX MICROSCOPIC
Bilirubin Urine: NEGATIVE
Glucose, UA: NEGATIVE mg/dL
Hgb urine dipstick: NEGATIVE
Ketones, ur: NEGATIVE mg/dL
Nitrite: NEGATIVE
Protein, ur: NEGATIVE mg/dL
Specific Gravity, Urine: 1.015 (ref 1.005–1.030)
pH: 7.5 (ref 5.0–8.0)

## 2021-11-11 LAB — URINALYSIS, MICROSCOPIC (REFLEX): RBC / HPF: NONE SEEN RBC/hpf (ref 0–5)

## 2021-11-11 NOTE — Discharge Instructions (Signed)
The feeling of "being off", fatigue and stomach issues my be a result of your amlodipine and you should discuss this with your PCP.  Catches in your side can occur from time to time and are not serious.  You can use OTC Gas-X as needed for gas symptoms if that is what you believe is the cause of your abdominal discomfort.  If you develop a headache that comes on and stays, is associated with changes in vision or dizziness, nausea or vomiting, you should return for re-evaluation.  Similarly if you develop abdominal discomfort that becomes constant please return for re-evaluation.

## 2021-11-11 NOTE — ED Triage Notes (Signed)
Patient is here for "Head pain, Rib pain". History of UTI, since last diagnosis of this and start of low dose of Hypertension medication "just not right". Once in a while, Right lower side, rib pain. Only "once in a while". Head ache/pain "above right eye". No visual disturbance, No concerns with "eye".

## 2021-11-11 NOTE — ED Provider Notes (Signed)
[facility]    CSN: [id number] Arrival date & time: 11/11/21  1007      History   Chief Complaint Chief Complaint  Patient presents with   Rib Pain (Non Chest)    HPI Janet Scott is a 76 y.o. female.   HPI  76 year old female here for evaluation of multiple complaints.  Patient ports that she has been experiencing a multitude of symptoms off and on, and none at present, for a number of months.  She had her annual physical in August and then in October was started on a low-dose of amlodipine for high blood pressure.  She is never been on medication for blood pressure prior to that.  Since then she describes her self is feeling "just not right".  She states that she has been experiencing a lot of fatigue as well as some intermittent abdominal "weakness".  She states is difficult to describe but she thinks it may gas or bloating.  She also experiences intermittent shooting pains above her right eye.  These are not associated with any change in vision, nausea, or vomiting.  The abdominal discomfort is also not associate with nausea, vomiting, or diarrhea.  She denies any pain with urination or urinary urgency or frequency.  Additionally, she will have an intermittent catch under her right ribs.  This is not associate with chest pain or shortness of breath.  She is not having this catch now.  She has not discussed the symptoms with her PCP but she came in here to be evaluated.  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 Authorizing Provider  amLODipine (NORVASC) 2.5 MG tablet Take 2.5 mg by mouth daily. 11/06/21  Yes [provider]  aspirin EC 81 MG tablet Take 81 mg by mouth daily. 06/25/21  Yes [provider]  cetirizine (ZYRTEC) 10 MG tablet Take 1 tablet by mouth daily.    [provider]  ipratropium (ATROVENT) 0.06 % nasal spray Place 2 sprays into both nostrils 4 (four) times daily. 04/05/21   Margarette Canada, NP  traMADol (ULTRAM) 50 MG tablet Take 1 tablet (50 mg total) by mouth every 8 (eight) hours as needed for moderate pain or severe pain. 12/23/19   Coral Spikes, DO    Family History Family History  Problem Relation Age of Onset   Other Father        "  stomach flu"   Cancer Father        oral   Other Brother        suicide   Breast cancer Neg Hx     Social History Social History   Tobacco Use   Smoking status: Never   Smokeless tobacco: Never  Vaping Use   Vaping Use: Never used  Substance Use Topics   Alcohol use: No   Drug use: No     Allergies   Etodolac   Review of Systems Review of Systems  Constitutional:  Negative for fever.  Eyes:  Negative for visual disturbance.  Respiratory:  Negative for cough, shortness of breath and wheezing.   Cardiovascular:  Negative for chest pain.  Gastrointestinal:  Positive for abdominal distention and abdominal pain. Negative for diarrhea, nausea and vomiting.  Skin:  Negative for rash.  Neurological:  Positive for headaches. Negative for weakness and numbness.  Hematological: Negative.   Psychiatric/Behavioral: Negative.      Physical Exam Triage Vital Signs ED Triage Vitals [11/11/21 1045]  Enc Vitals Group     BP 139/79      Pulse Rate 89     Resp 18     Temp 98.4 F (36.9 C)     Temp Source Oral     SpO2 100 %     Weight 162 lb 14.7 oz (73.9 kg)     Height      Head Circumference      Peak Flow      Pain Score 0     Pain Loc      Pain Edu?      Excl. in [place]?    No data found.  Updated Vital Signs BP 139/79 (BP Location: Left Arm)    Pulse 89    Temp 98.4 F (36.9 C) (Oral)    Resp 18    Wt 162 lb 14.7 oz (73.9 kg)    SpO2 100%    BMI 29.80 kg/m   Visual Acuity Right Eye Distance:   Left Eye Distance:   Bilateral Distance:    Right Eye Near:   Left Eye Near:    Bilateral Near:     Physical Exam Vitals and nursing note reviewed.  Constitutional:      Appearance: Normal appearance. She is not ill-appearing.  HENT:     Head: Normocephalic and atraumatic.     Mouth/Throat:     Mouth: Mucous membranes are moist.     Pharynx: Oropharynx is clear. No posterior oropharyngeal erythema.  Eyes:     Extraocular Movements: Extraocular movements intact.     Pupils: Pupils are equal, round, and reactive to light.  Cardiovascular:     Rate and Rhythm: Normal rate and regular rhythm.     Pulses: Normal pulses.     Heart sounds: Normal heart sounds. No murmur heard.   No friction rub. No gallop.  Pulmonary:     Effort: Pulmonary effort is normal.     Breath sounds: Normal breath sounds. No wheezing, rhonchi or rales.  Abdominal:     Palpations: Abdomen is soft.     Tenderness: There is no abdominal tenderness.  Musculoskeletal:     Cervical back: Normal range of motion and neck supple.  Lymphadenopathy:     Cervical: No cervical adenopathy.  Skin:    General: Skin is warm and dry.     Capillary Refill: Capillary refill takes less than 2 seconds.     Findings: No erythema  or rash.  Neurological:     General: No focal deficit present.     Mental Status: She is alert and oriented to person, place, and time.  Psychiatric:        Mood and Affect: Mood normal.        Behavior: Behavior normal.         Thought Content: Thought content normal.        Judgment: Judgment normal.     UC Treatments / Results  Labs (all labs ordered are listed, but only abnormal results are displayed) Labs Reviewed  URINALYSIS, ROUTINE W REFLEX MICROSCOPIC - Abnormal; Notable for the following components:      Result Value   Leukocytes,Ua TRACE (*)    All other components within normal limits  URINALYSIS, MICROSCOPIC (REFLEX) - Abnormal; Notable for the following components:   Bacteria, UA RARE (*)    All other components within normal limits    EKG   Radiology No results found.  Procedures Procedures (including critical care time)  Medications Ordered in UC Medications - No data to display  Initial Impression / Assessment and Plan / UC Course  I have reviewed the triage vital signs and the nursing notes.  Pertinent labs & imaging results that were available during my care of the patient were reviewed by me and considered in my medical decision making (see chart for details).  Patient is a nontoxic-appearing 76 year old female here for evaluation of a variety of symptoms that are not present currently but have been present in various times in the past several months.  She is concerned that she is not tolerating her blood pressure medication well because she states that she has been having fatigue as well as some intermittent possible distention or discomfort in her abdomen.  She describes it as her abdomen has been "weak".  This not associated with nausea, vomiting, or diarrhea.  She was treated for a UTI on 09/11/2021 and reports that she just "did not feel right then either".  She is not currently experiencing any painful urination or urinary urgency or frequency.  She also indicates intermittent shooting pain above her right eye that is not associated with change in vision, dizziness, or vomiting.  Her other complaint is an intermittent catch underneath her right ribs which is also not present  currently and is not associated with chest pain or shortness of breath.  On her exam patient is in no acute distress and she is able to carry on a conversation.  She has no dyspnea or tachypnea.  She is afebrile and her vital signs are stable.  Blood pressure is good at 139/79.  Cardiopulmonary dam reveals S1-S2 heart sounds with regular rate and rhythm.  Lung sounds are clear auscultation all fields.  Abdomen is protuberant but soft and nontender.  Advised patient that some of her fatigue and abdominal discomfort may very well be coming from the blood pressure medicine and she should discuss this with her PCP.  The other symptoms are intermittent and resolve quickly and are not generally concerning.  I advised her that if she has a headache that comes on, does not go away, or is associated with vomiting or change in vision, or dizziness that she should be evaluated for that.  Similarly, if she develops abdominal pain that comes and stays and is associated without the severe pain, nausea, vomiting, or diarrhea though should be reevaluated.  She asked if gas could cause some of her symptoms and I said  yes it could cause the catch under her ribs or may be the discomfort she is feeling in her abdomen.  She is welcome to try over-the-counter Gas-X and see if it helps her symptoms.  In any event, these are all concerns that she should discuss with her PCP.   Final Clinical Impressions(s) / UC Diagnoses   Final diagnoses:  Worried well     Discharge Instructions      The feeling of "being off", fatigue and stomach issues my be a result of your amlodipine and you should discuss this with your PCP.  Catches in your side can occur from time to time and are not serious.  You can use OTC Gas-X as needed for gas symptoms if that is what you believe is the cause of your abdominal discomfort.  If you develop a headache that comes on and stays, is associated with changes in vision or dizziness, nausea or  vomiting, you should return for re-evaluation.  Similarly if you develop abdominal discomfort that becomes constant please return for re-evaluation.     ED Prescriptions   None    PDMP not reviewed this encounter.   Margarette Canada, NP 11/11/21 1225

## 2021-12-02 DIAGNOSIS — Z0130 Encounter for examination of blood pressure without abnormal findings: Secondary | ICD-10-CM | POA: Diagnosis not present

## 2021-12-02 DIAGNOSIS — Z0000 Encounter for general adult medical examination without abnormal findings: Secondary | ICD-10-CM | POA: Diagnosis not present

## 2021-12-02 DIAGNOSIS — I10 Essential (primary) hypertension: Secondary | ICD-10-CM | POA: Diagnosis not present

## 2021-12-09 DIAGNOSIS — Z0100 Encounter for examination of eyes and vision without abnormal findings: Secondary | ICD-10-CM | POA: Diagnosis not present

## 2021-12-09 DIAGNOSIS — H5213 Myopia, bilateral: Secondary | ICD-10-CM | POA: Diagnosis not present

## 2021-12-26 DIAGNOSIS — I10 Essential (primary) hypertension: Secondary | ICD-10-CM | POA: Diagnosis not present

## 2021-12-26 DIAGNOSIS — Z0130 Encounter for examination of blood pressure without abnormal findings: Secondary | ICD-10-CM | POA: Diagnosis not present

## 2021-12-26 DIAGNOSIS — E669 Obesity, unspecified: Secondary | ICD-10-CM | POA: Diagnosis not present

## 2021-12-26 DIAGNOSIS — Z0000 Encounter for general adult medical examination without abnormal findings: Secondary | ICD-10-CM | POA: Diagnosis not present

## 2022-05-30 DIAGNOSIS — E669 Obesity, unspecified: Secondary | ICD-10-CM | POA: Diagnosis not present

## 2022-05-30 DIAGNOSIS — Z0131 Encounter for examination of blood pressure with abnormal findings: Secondary | ICD-10-CM | POA: Diagnosis not present

## 2022-05-30 DIAGNOSIS — I10 Essential (primary) hypertension: Secondary | ICD-10-CM | POA: Diagnosis not present

## 2022-05-30 DIAGNOSIS — Z1389 Encounter for screening for other disorder: Secondary | ICD-10-CM | POA: Diagnosis not present

## 2022-05-30 DIAGNOSIS — Z0130 Encounter for examination of blood pressure without abnormal findings: Secondary | ICD-10-CM | POA: Diagnosis not present

## 2022-07-08 DIAGNOSIS — I10 Essential (primary) hypertension: Secondary | ICD-10-CM | POA: Diagnosis not present

## 2022-07-08 DIAGNOSIS — Z23 Encounter for immunization: Secondary | ICD-10-CM | POA: Diagnosis not present

## 2022-07-08 DIAGNOSIS — G4452 New daily persistent headache (NDPH): Secondary | ICD-10-CM | POA: Diagnosis not present

## 2022-07-10 DIAGNOSIS — G4452 New daily persistent headache (NDPH): Secondary | ICD-10-CM | POA: Diagnosis not present

## 2022-07-10 DIAGNOSIS — Z0131 Encounter for examination of blood pressure with abnormal findings: Secondary | ICD-10-CM | POA: Diagnosis not present

## 2022-07-10 DIAGNOSIS — Z0130 Encounter for examination of blood pressure without abnormal findings: Secondary | ICD-10-CM | POA: Diagnosis not present

## 2022-07-10 DIAGNOSIS — R5383 Other fatigue: Secondary | ICD-10-CM | POA: Diagnosis not present

## 2022-07-10 DIAGNOSIS — Z1389 Encounter for screening for other disorder: Secondary | ICD-10-CM | POA: Diagnosis not present

## 2022-07-10 DIAGNOSIS — I10 Essential (primary) hypertension: Secondary | ICD-10-CM | POA: Diagnosis not present

## 2022-07-15 DIAGNOSIS — Z0000 Encounter for general adult medical examination without abnormal findings: Secondary | ICD-10-CM | POA: Diagnosis not present

## 2022-07-17 DIAGNOSIS — Z888 Allergy status to other drugs, medicaments and biological substances status: Secondary | ICD-10-CM | POA: Diagnosis not present

## 2022-07-17 DIAGNOSIS — M542 Cervicalgia: Secondary | ICD-10-CM | POA: Diagnosis not present

## 2022-07-17 DIAGNOSIS — I10 Essential (primary) hypertension: Secondary | ICD-10-CM | POA: Diagnosis not present

## 2022-07-17 DIAGNOSIS — M1990 Unspecified osteoarthritis, unspecified site: Secondary | ICD-10-CM | POA: Diagnosis not present

## 2022-07-17 DIAGNOSIS — Z87891 Personal history of nicotine dependence: Secondary | ICD-10-CM | POA: Diagnosis not present

## 2022-07-17 DIAGNOSIS — Z7982 Long term (current) use of aspirin: Secondary | ICD-10-CM | POA: Diagnosis not present

## 2022-07-17 DIAGNOSIS — M549 Dorsalgia, unspecified: Secondary | ICD-10-CM | POA: Diagnosis not present

## 2022-07-17 DIAGNOSIS — R519 Headache, unspecified: Secondary | ICD-10-CM | POA: Diagnosis not present

## 2022-07-21 ENCOUNTER — Other Ambulatory Visit: Payer: Self-pay | Admitting: Family Medicine

## 2022-07-21 DIAGNOSIS — G4452 New daily persistent headache (NDPH): Secondary | ICD-10-CM

## 2022-07-23 ENCOUNTER — Ambulatory Visit
Admission: RE | Admit: 2022-07-23 | Discharge: 2022-07-23 | Disposition: A | Discharge status: Home / Self Care | Payer: Medicare HMO | Source: Ambulatory Visit | Attending: Family Medicine | Admitting: Family Medicine

## 2022-07-23 DIAGNOSIS — G4452 New daily persistent headache (NDPH): Secondary | ICD-10-CM | POA: Diagnosis not present

## 2022-07-23 DIAGNOSIS — H04123 Dry eye syndrome of bilateral lacrimal glands: Secondary | ICD-10-CM | POA: Diagnosis not present

## 2022-07-23 DIAGNOSIS — R519 Headache, unspecified: Secondary | ICD-10-CM | POA: Diagnosis not present

## 2022-07-23 MED ORDER — GADOBUTROL 1 MMOL/ML IV SOLN
7.0000 mL | Freq: Once | INTRAVENOUS | Status: AC | PRN
  Administered 2022-07-23: 7 mL via INTRAVENOUS

## 2022-07-25 DIAGNOSIS — M2540 Effusion, unspecified joint: Secondary | ICD-10-CM | POA: Diagnosis not present

## 2022-07-31 DIAGNOSIS — M4802 Spinal stenosis, cervical region: Secondary | ICD-10-CM | POA: Diagnosis not present

## 2022-07-31 DIAGNOSIS — Z0130 Encounter for examination of blood pressure without abnormal findings: Secondary | ICD-10-CM | POA: Diagnosis not present

## 2022-07-31 DIAGNOSIS — G4452 New daily persistent headache (NDPH): Secondary | ICD-10-CM | POA: Diagnosis not present

## 2022-07-31 DIAGNOSIS — Z0131 Encounter for examination of blood pressure with abnormal findings: Secondary | ICD-10-CM | POA: Diagnosis not present

## 2022-07-31 DIAGNOSIS — M2540 Effusion, unspecified joint: Secondary | ICD-10-CM | POA: Diagnosis not present

## 2022-07-31 DIAGNOSIS — Z1389 Encounter for screening for other disorder: Secondary | ICD-10-CM | POA: Diagnosis not present

## 2022-07-31 DIAGNOSIS — I10 Essential (primary) hypertension: Secondary | ICD-10-CM | POA: Diagnosis not present

## 2022-08-08 DIAGNOSIS — Z0130 Encounter for examination of blood pressure without abnormal findings: Secondary | ICD-10-CM | POA: Diagnosis not present

## 2022-08-08 DIAGNOSIS — M25512 Pain in left shoulder: Secondary | ICD-10-CM | POA: Diagnosis not present

## 2022-08-08 DIAGNOSIS — I10 Essential (primary) hypertension: Secondary | ICD-10-CM | POA: Diagnosis not present

## 2022-08-08 DIAGNOSIS — G4452 New daily persistent headache (NDPH): Secondary | ICD-10-CM | POA: Diagnosis not present

## 2022-08-08 DIAGNOSIS — R5383 Other fatigue: Secondary | ICD-10-CM | POA: Diagnosis not present

## 2022-08-08 DIAGNOSIS — Z1389 Encounter for screening for other disorder: Secondary | ICD-10-CM | POA: Diagnosis not present

## 2022-08-11 ENCOUNTER — Other Ambulatory Visit: Payer: Self-pay | Admitting: Family Medicine

## 2022-08-11 DIAGNOSIS — Z1231 Encounter for screening mammogram for malignant neoplasm of breast: Secondary | ICD-10-CM

## 2022-09-05 ENCOUNTER — Ambulatory Visit
Admission: RE | Admit: 2022-09-05 | Discharge: 2022-09-05 | Disposition: A | Discharge status: Home / Self Care | Payer: Medicare HMO | Source: Ambulatory Visit | Attending: Family Medicine | Admitting: Family Medicine

## 2022-09-05 ENCOUNTER — Other Ambulatory Visit: Payer: Self-pay | Admitting: Family Medicine

## 2022-09-05 ENCOUNTER — Ambulatory Visit
Admission: RE | Admit: 2022-09-05 | Discharge: 2022-09-05 | Disposition: A | Discharge status: Home / Self Care | Payer: Medicare HMO | Attending: Family Medicine | Admitting: Family Medicine

## 2022-09-05 DIAGNOSIS — M19012 Primary osteoarthritis, left shoulder: Secondary | ICD-10-CM | POA: Insufficient documentation

## 2022-09-05 DIAGNOSIS — M25512 Pain in left shoulder: Secondary | ICD-10-CM

## 2022-09-05 DIAGNOSIS — Z1231 Encounter for screening mammogram for malignant neoplasm of breast: Secondary | ICD-10-CM

## 2022-09-11 ENCOUNTER — Other Ambulatory Visit: Payer: Self-pay | Admitting: Family Medicine

## 2022-09-11 DIAGNOSIS — R928 Other abnormal and inconclusive findings on diagnostic imaging of breast: Secondary | ICD-10-CM

## 2022-09-11 DIAGNOSIS — R921 Mammographic calcification found on diagnostic imaging of breast: Secondary | ICD-10-CM

## 2022-09-17 ENCOUNTER — Ambulatory Visit
Admission: RE | Admit: 2022-09-17 | Discharge: 2022-09-17 | Disposition: A | Discharge status: Home / Self Care | Payer: Medicare HMO | Source: Ambulatory Visit | Attending: Family Medicine | Admitting: Family Medicine

## 2022-09-17 DIAGNOSIS — R921 Mammographic calcification found on diagnostic imaging of breast: Secondary | ICD-10-CM | POA: Diagnosis not present

## 2022-09-17 DIAGNOSIS — R928 Other abnormal and inconclusive findings on diagnostic imaging of breast: Secondary | ICD-10-CM | POA: Insufficient documentation

## 2022-09-23 ENCOUNTER — Other Ambulatory Visit: Payer: Self-pay | Admitting: Family Medicine

## 2022-09-23 DIAGNOSIS — R921 Mammographic calcification found on diagnostic imaging of breast: Secondary | ICD-10-CM

## 2022-09-23 DIAGNOSIS — R928 Other abnormal and inconclusive findings on diagnostic imaging of breast: Secondary | ICD-10-CM

## 2022-09-24 DIAGNOSIS — H04123 Dry eye syndrome of bilateral lacrimal glands: Secondary | ICD-10-CM | POA: Diagnosis not present

## 2022-10-01 ENCOUNTER — Ambulatory Visit
Admission: RE | Admit: 2022-10-01 | Discharge: 2022-10-01 | Disposition: A | Discharge status: Home / Self Care | Payer: Medicare HMO | Source: Ambulatory Visit | Attending: Family Medicine | Admitting: Family Medicine

## 2022-10-01 DIAGNOSIS — R928 Other abnormal and inconclusive findings on diagnostic imaging of breast: Secondary | ICD-10-CM

## 2022-10-01 DIAGNOSIS — R921 Mammographic calcification found on diagnostic imaging of breast: Secondary | ICD-10-CM

## 2022-10-01 HISTORY — PX: BREAST BIOPSY: SHX20

## 2022-10-01 MED ORDER — LIDOCAINE HCL (PF) 2 % IJ SOLN
15.0000 mL | Freq: Once | INTRAMUSCULAR | Status: AC
  Administered 2022-10-01: 15 mL

## 2022-10-01 MED ORDER — LIDOCAINE-EPINEPHRINE 1 %-1:100000 IJ SOLN
10.0000 mL | Freq: Once | INTRAMUSCULAR | Status: AC
  Administered 2022-10-01: 10 mL

## 2022-10-03 LAB — SURGICAL PATHOLOGY

## 2022-10-07 ENCOUNTER — Encounter: Payer: Self-pay | Admitting: *Deleted

## 2022-10-07 NOTE — Progress Notes (Signed)
Referral recieved from [facility] for benign breast mass.  Janet Scott is considering her options of where she would like to be seen and will let me know her decision.

## 2022-10-13 ENCOUNTER — Encounter: Payer: Self-pay | Admitting: *Deleted

## 2022-10-13 NOTE — Progress Notes (Signed)
Called patient to see if she had decided which surgeon she would like to see.   Voicemail left asking for a return call.

## 2022-10-15 DIAGNOSIS — Z0130 Encounter for examination of blood pressure without abnormal findings: Secondary | ICD-10-CM | POA: Diagnosis not present

## 2022-10-15 DIAGNOSIS — R928 Other abnormal and inconclusive findings on diagnostic imaging of breast: Secondary | ICD-10-CM | POA: Diagnosis not present

## 2022-10-15 DIAGNOSIS — E278 Other specified disorders of adrenal gland: Secondary | ICD-10-CM | POA: Diagnosis not present

## 2022-10-15 DIAGNOSIS — I10 Essential (primary) hypertension: Secondary | ICD-10-CM | POA: Diagnosis not present

## 2022-10-15 DIAGNOSIS — Z1389 Encounter for screening for other disorder: Secondary | ICD-10-CM | POA: Diagnosis not present

## 2022-10-22 ENCOUNTER — Other Ambulatory Visit: Payer: Self-pay | Admitting: Family Medicine

## 2022-10-22 ENCOUNTER — Encounter: Payer: Self-pay | Admitting: *Deleted

## 2022-10-22 DIAGNOSIS — Z0130 Encounter for examination of blood pressure without abnormal findings: Secondary | ICD-10-CM | POA: Diagnosis not present

## 2022-10-22 DIAGNOSIS — M7989 Other specified soft tissue disorders: Secondary | ICD-10-CM

## 2022-10-22 DIAGNOSIS — R928 Other abnormal and inconclusive findings on diagnostic imaging of breast: Secondary | ICD-10-CM | POA: Diagnosis not present

## 2022-10-22 DIAGNOSIS — Z1389 Encounter for screening for other disorder: Secondary | ICD-10-CM | POA: Diagnosis not present

## 2022-10-22 DIAGNOSIS — I10 Essential (primary) hypertension: Secondary | ICD-10-CM | POA: Diagnosis not present

## 2022-10-22 DIAGNOSIS — Z0131 Encounter for examination of blood pressure with abnormal findings: Secondary | ICD-10-CM | POA: Diagnosis not present

## 2022-10-22 DIAGNOSIS — M799 Soft tissue disorder, unspecified: Secondary | ICD-10-CM | POA: Diagnosis not present

## 2022-10-22 NOTE — Progress Notes (Signed)
Called to see if Janet Scott has decided on surgical consultation.  Voice mail left with her daughter asking for return call.

## 2022-10-24 ENCOUNTER — Encounter: Payer: Self-pay | Admitting: *Deleted

## 2022-10-24 DIAGNOSIS — N6081 Other benign mammary dysplasias of right breast: Secondary | ICD-10-CM

## 2022-10-24 NOTE — Progress Notes (Signed)
Spoke with daughter Lavella Lemons and Ms. Burgener would like to see Dr. Christian Mate.   Referral sent to [facility].

## 2022-10-29 ENCOUNTER — Ambulatory Visit
Admission: RE | Admit: 2022-10-29 | Discharge: 2022-10-29 | Disposition: A | Discharge status: Home / Self Care | Payer: Medicare HMO | Source: Ambulatory Visit | Attending: Family Medicine | Admitting: Family Medicine

## 2022-10-29 DIAGNOSIS — Z0389 Encounter for observation for other suspected diseases and conditions ruled out: Secondary | ICD-10-CM | POA: Diagnosis not present

## 2022-10-29 DIAGNOSIS — M7989 Other specified soft tissue disorders: Secondary | ICD-10-CM | POA: Diagnosis not present

## 2022-10-30 ENCOUNTER — Ambulatory Visit (INDEPENDENT_AMBULATORY_CARE_PROVIDER_SITE_OTHER): Payer: Medicare HMO | Admitting: Surgery

## 2022-10-30 ENCOUNTER — Ambulatory Visit: Payer: Self-pay | Admitting: Surgery

## 2022-10-30 ENCOUNTER — Encounter: Payer: Self-pay | Admitting: Surgery

## 2022-10-30 VITALS — BP 163/74 | HR 77 | Temp 98.1°F | Ht 61.0 in | Wt 148.8 lb

## 2022-10-30 DIAGNOSIS — N6081 Other benign mammary dysplasias of right breast: Secondary | ICD-10-CM

## 2022-10-30 NOTE — Patient Instructions (Signed)
Our surgery scheduler Barbara will call you within 24-48 hours to get you scheduled. If you have not heard from her after 48 hours, please call our office. Have the blue sheet available when she calls to write down important information.   If you have any concerns or questions, please feel free to call our office.  Lumpectomy  A lumpectomy, sometimes called a partial mastectomy, is surgery to remove a cancerous tumor or mass (the lump) from a breast. It is a form of breast-conserving or breast-preservation surgery. This means that the cancerous tissue is removed but the breast remains intact. During a lumpectomy, the portion of the breast that contains the tumor is removed. Lymph nodes under your arm may also be removed. Lymph nodes are part of the body's disease-fighting system (immune system) and are usually the first place where breast cancer spreads. Tell a health care provider about: Any allergies you have. All medicines you are taking, including vitamins, herbs, eye drops, creams, and over-the-counter medicines. Any problems you or family members have had with anesthetic medicines. Any bleeding problems you have. Any surgeries you have had. Any medical conditions you have. Whether you are pregnant or may be pregnant. What are the risks? Generally, this is a safe procedure. However, problems may occur, including: Bleeding. Infection. Allergic reaction to medicines. Pain, swelling, weakness, or numbness in the arm on the side of your surgery. Temporary swelling. Change in the shape of the breast, especially if a large portion is removed. Scar tissue that forms at the surgical site and feels hard to the touch. Blood clots. What happens before the procedure? When to stop eating and drinking Follow instructions from your health care provider about what you may eat and drink. These may include: 8 hours before your procedure Stop eating most foods. Do not eat meat, fried foods, or fatty  foods. Eat only light foods, such as toast or crackers. All liquids are okay except energy drinks and alcohol. 6 hours before your procedure Stop eating. Drink only clear liquids, such as water, clear fruit juice, black coffee, plain tea, and sports drinks. Do not drink energy drinks or alcohol. 2 hours before your procedure Stop drinking all liquids. You may be allowed to take medicines with small sips of water. If you do not follow your health care provider's instructions, your procedure may be delayed or canceled. Medicines Ask your health care provider about: Changing or stopping your regular medicines. These include any diabetes medicines or blood thinners you take. Taking medicines such as aspirin and ibuprofen. These medicines can thin your blood. Do not take them unless your health care provider tells you to. Taking over-the-counter medicines, vitamins, herbs, and supplements. Surgery safety Ask your health care provider how your surgery site will be marked. A procedure may be done to locate and mark the tumor area in the breast (localization). This will guide the surgeon to where the incision will be made. This may be done with: Imaging, such as a mammogram, ultrasound, or MRI. Insertion of a small wire, clip, or seed, or an implant that will reflect a radar signal. Also, ask what steps will be taken to help prevent infection. These may include: Washing skin with a germ-killing soap. Taking antibiotic medicine. General instructions You may have screening tests or exams to get normal measurements of your arm, also called baseline measurements. These can be compared to measurements done after surgery to monitor for swelling (lymphedema) that can develop after having lymph nodes removed. If you   will be going home right after the procedure, plan to have a responsible adult: Take you home from the hospital or clinic. You will not be allowed to drive. Care for you for the time you are  told. What happens during the procedure?  An IV will be inserted into one of your veins. You may be given: A sedative. This helps you relax. Anesthesia. This will: Numb certain areas of your body. Make you fall asleep for surgery. An electric scalpel will be used to reduce bleeding (electrocautery knife). A curved incision that follows the natural curve of your breast will be made. The tumor will be removed along with some of the tissue around it. This will be sent to the lab for testing. Your health care provider may also remove lymph nodes at this time if needed. If the tumor is close to the muscles over your chest, some muscle tissue may also be removed. A small drain tube may be inserted into your breast area or armpit to collect fluid that may build up after surgery. This tube will be connected to a suction bulb on the outside of your body to remove the fluid. The incision will be closed with stitches (sutures). A bandage (dressing) may be placed over the incision. The procedure may vary among health care providers and hospitals. What happens after the procedure? Your blood pressure, heart rate, breathing rate, and blood oxygen level will be monitored until you leave the hospital or clinic. You will be given medicine for pain as needed. You will be encouraged to get up and walk as soon as you can. This will improve blood flow and breathing. Ask for help if you feel weak or unsteady. You may have a drain tube in place for 2-3 days to prevent a collection of blood (hematoma) from developing in the breast. You may have a pressure bandage applied for 1-2 days to prevent bleeding or swelling. Ask your health care provider how to care for your bandage at home. You may be given a tight sleeve to wear over your arm on the side of your surgery. Wear the sleeve as told by your health care provider. Do not drive or operate machinery until your health care provider says that it is safe. Where to  find more information American Cancer Society: cancer.org National Cancer Institute: cancer.gov Summary A lumpectomy, sometimes called a partial mastectomy, is surgery to remove a cancerous tumor or mass (the lump) from a breast. During a lumpectomy, the portion of the breast that contains the tumor is removed. Plan to have someone take you home from the hospital or clinic. You may have a drain tube in place for 2-3 days to prevent a collection of blood (hematoma) from developing in the breast. This information is not intended to replace advice given to you by your health care provider. Make sure you discuss any questions you have with your health care provider. Document Revised: 12/02/2021 Document Reviewed: 11/17/2021 Elsevier Patient Education  2023 Elsevier Inc.  

## 2022-10-30 NOTE — H&P (View-Only) (Signed)
Patient ID: Janet Scott, female   DOB: June 24, 1946, 77 y.o.   MRN: [id number]  Chief Complaint: Flat epithelial atypia, right breast  History of Present Illness Janet Scott is a 77 y.o. female with recent cluster of calcifications, right breast.  Stereotactic biopsy shows flat epithelial atypia without any other premalignant changes.  Postbiopsy mammogram appears to have little to no residual calcs present.  Patient has had no prior breast history. She utilized oral birth control when reproductive.  She had a partial hysterectomy in June 1993.  She has no family history of breast cancer.  She began menstruating at the age of 77.  She is gravida 2 para 2.  She was 19 when she would have had her first pregnancy.  She has had no other breast symptoms including denying any nipple discharge, skin changes or prior breast pain.  She has never palpated a lump or any nodularity in either breast.  Past Medical History Past Medical History:  Diagnosis Date   Leiomyoma 1993   Menorrhagia    Seasonal allergies       Past Surgical History:  Procedure Laterality Date   ABDOMINAL HYSTERECTOMY  02/1992   Total  ([facility])   BREAST BIOPSY Right 10/01/2022   Stereo Bx, Coil Clip, Path Pending   BREAST BIOPSY Right 10/01/2022   MM RT BREAST BX W LOC DEV 1ST LESION IMAGE BX SPEC STEREO GUIDE 10/01/2022 [facility]   CERVICAL SPINE SURGERY     DILATION AND CURETTAGE OF UTERUS  1983    Allergies  Allergen Reactions   Etodolac Photosensitivity and Rash    Current Outpatient Medications  Medication Sig Dispense Refill   amLODipine (NORVASC) 2.5 MG tablet Take 2.5 mg by mouth daily.     aspirin EC 81 MG tablet Take 81 mg by mouth daily.     cetirizine (ZYRTEC) 10 MG tablet Take 1 tablet by mouth daily.     No current facility-administered medications for this visit.    Family History Family History  Problem Relation Age of Onset   Other Father        "stomach flu"   Cancer Father         oral   Other Brother        suicide   Breast cancer Neg Hx       Social History Social History   Tobacco Use   Smoking status: Never   Smokeless tobacco: Never  Vaping Use   Vaping Use: Never used  Substance Use Topics   Alcohol use: No   Drug use: No        Review of Systems  Constitutional:  Positive for malaise/fatigue.  HENT: Negative.    Eyes: Negative.   Respiratory: Negative.    Cardiovascular: Negative.   Gastrointestinal: Negative.   Genitourinary: Negative.   Skin: Negative.   Neurological: Negative.   Psychiatric/Behavioral: Negative.       Physical Exam Blood pressure (!) 163/74, pulse 77, temperature 98.1 F (36.7 C), temperature source Oral, height '5\' 1"'$  (1.549 m), weight 148 lb 12.8 oz (67.5 kg), SpO2 97 %. Last Weight  Most recent update: 10/30/2022  1:51 PM    Weight  67.5 kg (148 lb 12.8 oz)             CONSTITUTIONAL: Well developed, and nourished, appropriately responsive and aware without distress.   EYES: Sclera non-icteric.   EARS, NOSE, MOUTH AND THROAT:  The oropharynx is clear. Oral mucosa is pink and moist.  Hearing is intact to voice.  NECK: Trachea is midline, and there is no jugular venous distension.  LYMPH NODES:  Lymph nodes in the neck are not appreciated. RESPIRATORY:   Normal respiratory effort without pathologic use of accessory muscles. CARDIOVASCULAR:  Well perfused.  GI: The abdomen is  soft, nontender, and nondistended.   MUSCULOSKELETAL:  Symmetrical muscle tone appreciated in all four extremities.    SKIN: Skin turgor is normal. No pathologic skin lesions appreciated.  NEUROLOGIC:  Motor and sensation appear grossly normal.  Cranial nerves are grossly without defect. PSYCH:  Alert and oriented to person, place and time. Affect is appropriate for situation.  Data Reviewed I have personally reviewed what is currently available of the patient's imaging, recent labs and medical records.   Labs:     Latest Ref Rng  & Units 06/09/2017    2:06 PM 04/14/2014   10:12 AM 04/02/2014    9:44 AM  CBC  WBC 3.6 - 11.0 K/uL 6.8  5.9  4.9   Hemoglobin 12.0 - 16.0 g/dL 13.4  12.6  12.5   Hematocrit 35.0 - 47.0 % 39.0  38.7  38.2   Platelets 150 - 440 K/uL 249  234  207       Latest Ref Rng & Units 06/09/2017    2:06 PM 01/01/2017   11:40 AM 04/14/2014   10:12 AM  CMP  Glucose 65 - 99 mg/dL 102  90  106   BUN 6 - 20 mg/dL '11  10  11   '$ Creatinine 0.44 - 1.00 mg/dL 0.85  0.82  0.93   Sodium 135 - 145 mmol/L 139  132  140   Potassium 3.5 - 5.1 mmol/L 4.0  3.7  4.3   Chloride 101 - 111 mmol/L 105  99  106   CO2 22 - 32 mmol/L '25  27  27   '$ Calcium 8.9 - 10.3 mg/dL 9.6  9.2  9.1   Total Protein 6.5 - 8.1 g/dL 8.1   8.0   Total Bilirubin 0.3 - 1.2 mg/dL 0.4   0.2   Alkaline Phos 38 - 126 U/L 52   61   AST 15 - 41 U/L 19   25   ALT 14 - 54 U/L 15   23    SURGICAL PATHOLOGY  CASE: [id number]  PATIENT: [place]  Surgical Pathology Report   Specimen Submitted:  A. Breast, right   Clinical History: Small group indeterminate amorphous CALCS r/o atypia  or DCIS   DIAGNOSIS:  A. 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  - NO DEFINITE IN SITU CARCINOMA OR INVASION.    Imaging: Radiological images reviewed:  CLINICAL DATA:  Callback for RIGHT breast calcifications   EXAM: DIGITAL DIAGNOSTIC UNILATERAL RIGHT MAMMOGRAM WITH TOMOSYNTHESIS   TECHNIQUE: Right digital diagnostic mammography and breast tomosynthesis was performed.   COMPARISON:  Previous exam(s).   ACR Breast Density Category a: The breast tissue is almost entirely fatty.   FINDINGS: Spot magnification views of the RIGHT breast demonstrate a 7 mm group of amorphous and punctate calcifications in the RIGHT upper outer breast at posterior depth. These are not definitively stable in comparison to remote prior mammograms.   IMPRESSION: A 7 mm  group of calcifications in the RIGHT breast is indeterminate. Recommend stereotactic guided biopsy for definitive characterization.   RECOMMENDATION: RIGHT breast stereotactic guided biopsy x1   I have discussed the findings and recommendations  with the patient. The biopsy procedure was discussed with the patient and questions were answered. Patient expressed their understanding of the biopsy recommendation. Patient will be scheduled for biopsy at her earliest convenience by the schedulers. Ordering provider will be notified. If applicable, a reminder letter will be sent to the patient regarding the next appointment.   BI-RADS CATEGORY  4: Suspicious.     Electronically Signed   By: Valentino Saxon M.D.   On: 09/17/2022 10:06 Within last 24 hrs: No results found.  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  Some studies show an 11.1% association with upstaging disease.  Other studies say that there is no reason to pursue excisional biopsy having fully removed the microcalcifications.  She had vacuum-assisted biopsies completed. She would like the peace of mind of doing all she can to minimize her risk and wants to proceed.  I discussed the available options with the patient.  Explained to the patient that after her surgical treatment additional treatment will depend on her prognostic indicators and stage.    I discussed risks of bleeding, infection, damage to surrounding tissues, having positive margins, needing further resection, damage to nerves causing arm numbness or difficulty raising arm, causing lymphedema in the arm; as well as  anesthesia risks of MI, stroke, prolonged ventilation, pulmonary embolism, thrombosis and even death.   Patient was given the opportunity to ask questions and have them answered.    They would like to proceed with right breast RFID localized excisional biopsy.  Face-to-face time spent with the patient and accompanying care providers(if present) was 30 minutes, with more than 50% of the time spent counseling, educating, and coordinating care of the patient.    These notes generated with voice recognition software. I apologize for typographical errors.  Ronny Bacon M.D., FACS 10/30/2022, 2:51 PM

## 2022-10-30 NOTE — Progress Notes (Signed)
Patient ID: Janet Scott, female   DOB: 12/25/1945, 76 y.o.   MRN: [id number]  Chief Complaint: Flat epithelial atypia, right breast  History of Present Illness Janet Scott is a 76 y.o. female with recent cluster of calcifications, right breast.  Stereotactic biopsy shows flat epithelial atypia without any other premalignant changes.  Postbiopsy mammogram appears to have little to no residual calcs present.  Patient has had no prior breast history. She utilized oral birth control when reproductive.  She had a partial hysterectomy in June 1993.  She has no family history of breast cancer.  She began menstruating at the age of 13.  She is gravida 2 para 2.  She was 19 when she would have had her first pregnancy.  She has had no other breast symptoms including denying any nipple discharge, skin changes or prior breast pain.  She has never palpated a lump or any nodularity in either breast.  Past Medical History Past Medical History:  Diagnosis Date   Leiomyoma 1993   Menorrhagia    Seasonal allergies       Past Surgical History:  Procedure Laterality Date   ABDOMINAL HYSTERECTOMY  02/1992   Total  ([facility])   BREAST BIOPSY Right 10/01/2022   Stereo Bx, Coil Clip, Path Pending   BREAST BIOPSY Right 10/01/2022   MM RT BREAST BX W LOC DEV 1ST LESION IMAGE BX SPEC STEREO GUIDE 10/01/2022 [facility]   CERVICAL SPINE SURGERY     DILATION AND CURETTAGE OF UTERUS  1983    Allergies  Allergen Reactions   Etodolac Photosensitivity and Rash    Current Outpatient Medications  Medication Sig Dispense Refill   amLODipine (NORVASC) 2.5 MG tablet Take 2.5 mg by mouth daily.     aspirin EC 81 MG tablet Take 81 mg by mouth daily.     cetirizine (ZYRTEC) 10 MG tablet Take 1 tablet by mouth daily.     No current facility-administered medications for this visit.    Family History Family History  Problem Relation Age of Onset   Other Father        "stomach flu"   Cancer Father         oral   Other Brother        suicide   Breast cancer Neg Hx       Social History Social History   Tobacco Use   Smoking status: Never   Smokeless tobacco: Never  Vaping Use   Vaping Use: Never used  Substance Use Topics   Alcohol use: No   Drug use: No        Review of Systems  Constitutional:  Positive for malaise/fatigue.  HENT: Negative.    Eyes: Negative.   Respiratory: Negative.    Cardiovascular: Negative.   Gastrointestinal: Negative.   Genitourinary: Negative.   Skin: Negative.   Neurological: Negative.   Psychiatric/Behavioral: Negative.       Physical Exam Blood pressure (!) 163/74, pulse 77, temperature 98.1 F (36.7 C), temperature source Oral, height 5' 1" (1.549 m), weight 148 lb 12.8 oz (67.5 kg), SpO2 97 %. Last Weight  Most recent update: 10/30/2022  1:51 PM    Weight  67.5 kg (148 lb 12.8 oz)             CONSTITUTIONAL: Well developed, and nourished, appropriately responsive and aware without distress.   EYES: Sclera non-icteric.   EARS, NOSE, MOUTH AND THROAT:  The oropharynx is clear. Oral mucosa is pink and moist.     Hearing is intact to voice.  NECK: Trachea is midline, and there is no jugular venous distension.  LYMPH NODES:  Lymph nodes in the neck are not appreciated. RESPIRATORY:   Normal respiratory effort without pathologic use of accessory muscles. CARDIOVASCULAR:  Well perfused.  GI: The abdomen is  soft, nontender, and nondistended.   MUSCULOSKELETAL:  Symmetrical muscle tone appreciated in all four extremities.    SKIN: Skin turgor is normal. No pathologic skin lesions appreciated.  NEUROLOGIC:  Motor and sensation appear grossly normal.  Cranial nerves are grossly without defect. PSYCH:  Alert and oriented to person, place and time. Affect is appropriate for situation.  Data Reviewed I have personally reviewed what is currently available of the patient's imaging, recent labs and medical records.   Labs:     Latest Ref Rng  & Units 06/09/2017    2:06 PM 04/14/2014   10:12 AM 04/02/2014    9:44 AM  CBC  WBC 3.6 - 11.0 K/uL 6.8  5.9  4.9   Hemoglobin 12.0 - 16.0 g/dL 13.4  12.6  12.5   Hematocrit 35.0 - 47.0 % 39.0  38.7  38.2   Platelets 150 - 440 K/uL 249  234  207       Latest Ref Rng & Units 06/09/2017    2:06 PM 01/01/2017   11:40 AM 04/14/2014   10:12 AM  CMP  Glucose 65 - 99 mg/dL 102  90  106   BUN 6 - 20 mg/dL 11  10  11   Creatinine 0.44 - 1.00 mg/dL 0.85  0.82  0.93   Sodium 135 - 145 mmol/L 139  132  140   Potassium 3.5 - 5.1 mmol/L 4.0  3.7  4.3   Chloride 101 - 111 mmol/L 105  99  106   CO2 22 - 32 mmol/L 25  27  27   Calcium 8.9 - 10.3 mg/dL 9.6  9.2  9.1   Total Protein 6.5 - 8.1 g/dL 8.1   8.0   Total Bilirubin 0.3 - 1.2 mg/dL 0.4   0.2   Alkaline Phos 38 - 126 U/L 52   61   AST 15 - 41 U/L 19   25   ALT 14 - 54 U/L 15   23    SURGICAL PATHOLOGY  CASE: [id number]  PATIENT: Arianie Snider  Surgical Pathology Report   Specimen Submitted:  A. Breast, right   Clinical History: Small group indeterminate amorphous CALCS r/o atypia  or DCIS   DIAGNOSIS:  A. 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  - NO DEFINITE IN SITU CARCINOMA OR INVASION.    Imaging: Radiological images reviewed:  CLINICAL DATA:  Callback for RIGHT breast calcifications   EXAM: DIGITAL DIAGNOSTIC UNILATERAL RIGHT MAMMOGRAM WITH TOMOSYNTHESIS   TECHNIQUE: Right digital diagnostic mammography and breast tomosynthesis was performed.   COMPARISON:  Previous exam(s).   ACR Breast Density Category a: The breast tissue is almost entirely fatty.   FINDINGS: Spot magnification views of the RIGHT breast demonstrate a 7 mm group of amorphous and punctate calcifications in the RIGHT upper outer breast at posterior depth. These are not definitively stable in comparison to remote prior mammograms.   IMPRESSION: A 7 mm  group of calcifications in the RIGHT breast is indeterminate. Recommend stereotactic guided biopsy for definitive characterization.   RECOMMENDATION: RIGHT breast stereotactic guided biopsy x1   I have discussed the findings and recommendations   with the patient. The biopsy procedure was discussed with the patient and questions were answered. Patient expressed their understanding of the biopsy recommendation. Patient will be scheduled for biopsy at her earliest convenience by the schedulers. Ordering provider will be notified. If applicable, a reminder letter will be sent to the patient regarding the next appointment.   BI-RADS CATEGORY  4: Suspicious.     Electronically Signed   By: Stephanie  Peacock M.D.   On: 09/17/2022 10:06 Within last 24 hrs: No results found.  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  Some studies show an 11.1% association with upstaging disease.  Other studies say that there is no reason to pursue excisional biopsy having fully removed the microcalcifications.  She had vacuum-assisted biopsies completed. She would like the peace of mind of doing all she can to minimize her risk and wants to proceed.  I discussed the available options with the patient.  Explained to the patient that after her surgical treatment additional treatment will depend on her prognostic indicators and stage.    I discussed risks of bleeding, infection, damage to surrounding tissues, having positive margins, needing further resection, damage to nerves causing arm numbness or difficulty raising arm, causing lymphedema in the arm; as well as  anesthesia risks of MI, stroke, prolonged ventilation, pulmonary embolism, thrombosis and even death.   Patient was given the opportunity to ask questions and have them answered.    They would like to proceed with right breast RFID localized excisional biopsy.  Face-to-face time spent with the patient and accompanying care providers(if present) was 30 minutes, with more than 50% of the time spent counseling, educating, and coordinating care of the patient.    These notes generated with voice recognition software. I apologize for typographical errors.  Rutherford Alarie M.D., FACS 10/30/2022, 2:51 PM     

## 2022-10-31 ENCOUNTER — Other Ambulatory Visit: Payer: Self-pay | Admitting: Surgery

## 2022-10-31 DIAGNOSIS — R928 Other abnormal and inconclusive findings on diagnostic imaging of breast: Secondary | ICD-10-CM

## 2022-10-31 DIAGNOSIS — N6081 Other benign mammary dysplasias of right breast: Secondary | ICD-10-CM

## 2022-11-03 ENCOUNTER — Other Ambulatory Visit: Payer: Self-pay | Admitting: Surgery

## 2022-11-03 DIAGNOSIS — R928 Other abnormal and inconclusive findings on diagnostic imaging of breast: Secondary | ICD-10-CM

## 2022-11-03 DIAGNOSIS — N6081 Other benign mammary dysplasias of right breast: Secondary | ICD-10-CM

## 2022-11-05 ENCOUNTER — Telehealth: Payer: Self-pay | Admitting: Surgery

## 2022-11-05 ENCOUNTER — Ambulatory Visit
Admission: RE | Admit: 2022-11-05 | Discharge: 2022-11-05 | Disposition: A | Discharge status: Home / Self Care | Payer: Medicare HMO | Source: Ambulatory Visit | Attending: Surgery | Admitting: Surgery

## 2022-11-05 DIAGNOSIS — R928 Other abnormal and inconclusive findings on diagnostic imaging of breast: Secondary | ICD-10-CM | POA: Insufficient documentation

## 2022-11-05 DIAGNOSIS — N6081 Other benign mammary dysplasias of right breast: Secondary | ICD-10-CM | POA: Insufficient documentation

## 2022-11-05 HISTORY — PX: BREAST BIOPSY: SHX20

## 2022-11-05 MED ORDER — LIDOCAINE HCL (PF) 1 % IJ SOLN
10.0000 mL | Freq: Once | INTRAMUSCULAR | Status: AC
  Administered 2022-11-05: 10 mL

## 2022-11-05 NOTE — Telephone Encounter (Signed)
Left message with daughter in law, Lavella Lemons.  Please inform her of the following regarding scheduled surgery with Dr. Christian Mate.   Pre-Admission date/time, and Surgery date at [facility].  Surgery Date: 11/19/22 Preadmission Testing Date: 11/11/22 (phone 1p-5p)  Also they will need to call at [phone], between 1-3:00pm the day before surgery, to find out what time to arrive for surgery.

## 2022-11-10 NOTE — Telephone Encounter (Signed)
Left another message for daughter in law, Lavella Lemons to call me.

## 2022-11-11 ENCOUNTER — Inpatient Hospital Stay: Admission: RE | Admit: 2022-11-11 | Payer: Medicare HMO | Source: Ambulatory Visit

## 2022-11-11 NOTE — Telephone Encounter (Signed)
Janet Scott, daughter in law calls back, they are informed of all dates regarding surgery.  She will also drop copy of POA so that we can scan in patient's chart.

## 2022-11-12 ENCOUNTER — Encounter
Admission: RE | Admit: 2022-11-12 | Discharge: 2022-11-12 | Disposition: A | Discharge status: Home / Self Care | Payer: Medicare HMO | Source: Ambulatory Visit | Attending: Surgery | Admitting: Surgery

## 2022-11-12 ENCOUNTER — Other Ambulatory Visit: Payer: Self-pay

## 2022-11-12 HISTORY — DX: Unspecified osteoarthritis, unspecified site: M19.90

## 2022-11-12 HISTORY — DX: Anemia, unspecified: D64.9

## 2022-11-12 NOTE — Patient Instructions (Addendum)
Your procedure is scheduled on: 11/19/22 - Wednesday Report to the Registration Desk on the 1st floor of the [facility]. To find out your arrival time, please call [phone] between 1PM - 3PM on: 11/18/22 - Tuesday If your arrival time is 6:00 am, do not arrive before that time as the [facility] entrance doors do not open until 6:00 am.  REMEMBER: Instructions that are not followed completely may result in serious medical risk, up to and including death; or upon the discretion of your surgeon and anesthesiologist your surgery may need to be rescheduled.  Do not eat food or drink any liquids after midnight the night before surgery.  No gum chewing or hard candies.  One week prior to surgery: Stop Anti-inflammatories (NSAIDS) such as Advil, Aleve, Ibuprofen, Motrin, Naproxen, Naprosyn and Aspirin based products such as Excedrin, Goody's Powder, BC Powder.  Stop ANY OVER THE COUNTER supplements until after surgery.  You may  take Tylenol if needed for pain up until the day of surgery.  TAKE ONLY THESE MEDICATIONS THE MORNING OF SURGERY WITH A SIP OF WATER:  NONE   HOLD Aspirin beginning 02 23/24.   No Alcohol for 24 hours before or after surgery.  No Smoking including e-cigarettes for 24 hours before surgery.  No chewable tobacco products for at least 6 hours before surgery.  No nicotine patches on the day of surgery.  Do not use any "recreational" drugs for at least a week (preferably 2 weeks) before your surgery.  Please be advised that the combination of cocaine and anesthesia may have negative outcomes, up to and including death. If you test positive for cocaine, your surgery will be cancelled.  On the morning of surgery brush your teeth with toothpaste and water, you may rinse your mouth with mouthwash if you wish. Do not swallow any toothpaste or mouthwash.  Use CHG Soap or wipes as directed on instruction sheet.  Do not wear jewelry, make-up, hairpins, clips or  nail polish.  Do not wear lotions, powders, or perfumes.   Do not shave body hair from the neck down 48 hours before surgery.  Contact lenses, hearing aids and dentures may not be worn into surgery.  Do not bring valuables to the hospital. [facility] is not responsible for any missing/lost belongings or valuables.   Notify your doctor if there is any change in your medical condition (cold, fever, infection).  Wear comfortable clothing (specific to your surgery type) to the hospital.  After surgery, you can help prevent lung complications by doing breathing exercises.  Take deep breaths and cough every 1-2 hours. Your doctor may order a device called an Incentive Spirometer to help you take deep breaths. When coughing or sneezing, hold a pillow firmly against your incision with both hands. This is called "splinting." Doing this helps protect your incision. It also decreases belly discomfort.  If you are being admitted to the hospital overnight, leave your suitcase in the car. After surgery it may be brought to your room.  In case of increased patient census, it may be necessary for you, the patient, to continue your postoperative care in the Same Day Surgery department.  If you are being discharged the day of surgery, you will not be allowed to drive home. You will need a responsible individual to drive you home and stay with you for 24 hours after surgery.   If you are taking public transportation, you will need to have a responsible individual with you.  Please call the [facility] Dept. at [phone] if you have any questions about these instructions.  Surgery Visitation Policy:  Patients undergoing a surgery or procedure may have two family members or support persons with them as long as the person is not COVID-19 positive or experiencing its symptoms.   Inpatient Visitation:    Visiting hours are 7 a.m. to 8 p.m. Up to four visitors are allowed at one time in a  patient room. The visitors may rotate out with other people during the day. One designated support person (adult) may remain overnight.  Due to an increase in RSV and influenza rates and associated hospitalizations, children ages 58 and under will not be able to visit patients in [facility]. Masks continue to be strongly recommended.     Preparing for Surgery with CHLORHEXIDINE GLUCONATE (CHG) Soap  Chlorhexidine Gluconate (CHG) Soap  o An antiseptic cleaner that kills germs and bonds with the skin to continue killing germs even after washing  o Used for showering the night before surgery and morning of surgery  Before surgery, you can play an important role by reducing the number of germs on your skin.  CHG (Chlorhexidine gluconate) soap is an antiseptic cleanser which kills germs and bonds with the skin to continue killing germs even after washing.  Please do not use if you have an allergy to CHG or antibacterial soaps. If your skin becomes reddened/irritated stop using the CHG.  1. Shower the NIGHT BEFORE SURGERY and the MORNING OF SURGERY with CHG soap.  2. If you choose to wash your hair, wash your hair first as usual with your normal shampoo.  3. After shampooing, rinse your hair and body thoroughly to remove the shampoo.  4. Use CHG as you would any other liquid soap. You can apply CHG directly to the skin and wash gently with a scrungie or a clean washcloth.  5. Apply the CHG soap to your body only from the neck down. Do not use on open wounds or open sores. Avoid contact with your eyes, ears, mouth, and genitals (private parts). Wash face and genitals (private parts) with your normal soap.  6. Wash thoroughly, paying special attention to the area where your surgery will be performed.  7. Thoroughly rinse your body with warm water.  8. Do not shower/wash with your normal soap after using and rinsing off the CHG soap.  9. Pat yourself dry with a clean  towel.  10. Wear clean pajamas to bed the night before surgery.  12. Place clean sheets on your bed the night of your first shower and do not sleep with pets.  13. Shower again with the CHG soap on the day of surgery prior to arriving at the hospital.  14. Do not apply any deodorants/lotions/powders.  15. Please wear clean clothes to the hospital.

## 2022-11-13 ENCOUNTER — Encounter
Admission: RE | Admit: 2022-11-13 | Discharge: 2022-11-13 | Disposition: A | Discharge status: Home / Self Care | Payer: Medicare HMO | Source: Ambulatory Visit | Attending: Surgery | Admitting: Surgery

## 2022-11-13 DIAGNOSIS — Z01818 Encounter for other preprocedural examination: Secondary | ICD-10-CM | POA: Diagnosis not present

## 2022-11-13 DIAGNOSIS — N6081 Other benign mammary dysplasias of right breast: Secondary | ICD-10-CM | POA: Diagnosis not present

## 2022-11-13 DIAGNOSIS — Z01810 Encounter for preprocedural cardiovascular examination: Secondary | ICD-10-CM | POA: Diagnosis not present

## 2022-11-13 LAB — BASIC METABOLIC PANEL
Anion gap: 9 (ref 5–15)
BUN: 14 mg/dL (ref 8–23)
CO2: 26 mmol/L (ref 22–32)
Calcium: 9.3 mg/dL (ref 8.9–10.3)
Chloride: 98 mmol/L (ref 98–111)
Creatinine, Ser: 0.73 mg/dL (ref 0.44–1.00)
GFR, Estimated: >60 mL/min (ref >60)
Glucose, Bld: 96 mg/dL (ref 70–99)
Potassium: 3.7 mmol/L (ref 3.5–5.1)
Sodium: 133 mmol/L — ABNORMAL LOW (ref 135–145)

## 2022-11-13 LAB — CBC WITH DIFFERENTIAL/PLATELET
Abs Immature Granulocytes: 0.01 10*3/uL (ref 0.00–0.07)
Basophils Absolute: 0.1 10*3/uL (ref 0.0–0.1)
Basophils Relative: 1 %
Eosinophils Absolute: 0.5 10*3/uL (ref 0.0–0.5)
Eosinophils Relative: 10 %
HCT: 34.5 % — ABNORMAL LOW (ref 36.0–46.0)
Hemoglobin: 11.5 g/dL — ABNORMAL LOW (ref 12.0–15.0)
Immature Granulocytes: 0 %
Lymphocytes Relative: 23 %
Lymphs Abs: 1.2 10*3/uL (ref 0.7–4.0)
MCH: 30.5 pg (ref 26.0–34.0)
MCHC: 33.3 g/dL (ref 30.0–36.0)
MCV: 91.5 fL (ref 80.0–100.0)
Monocytes Absolute: 0.5 10*3/uL (ref 0.1–1.0)
Monocytes Relative: 10 %
Neutro Abs: 2.9 10*3/uL (ref 1.7–7.7)
Neutrophils Relative %: 56 %
Platelets: 252 10*3/uL (ref 150–400)
RBC: 3.77 MIL/uL — ABNORMAL LOW (ref 3.87–5.11)
RDW: 12.3 % (ref 11.5–15.5)
WBC: 5.1 10*3/uL (ref 4.0–10.5)
nRBC: 0 % (ref 0.0–0.2)

## 2022-11-18 MED ORDER — CHLORHEXIDINE GLUCONATE 0.12 % MT SOLN: 15.0000 mL | Freq: Once | OROMUCOSAL | Status: AC

## 2022-11-18 MED ORDER — BUPIVACAINE LIPOSOME 1.3 % IJ SUSP: 20.0000 mL | Freq: Once | INTRAMUSCULAR | Status: DC

## 2022-11-18 MED ORDER — FAMOTIDINE 20 MG PO TABS: 20.0000 mg | ORAL_TABLET | Freq: Once | ORAL | Status: AC

## 2022-11-18 MED ORDER — CHLORHEXIDINE GLUCONATE CLOTH 2 % EX PADS
6.0000 | MEDICATED_PAD | Freq: Once | CUTANEOUS | Status: AC
  Administered 2022-11-19: 6 via TOPICAL

## 2022-11-18 MED ORDER — ACETAMINOPHEN 500 MG PO TABS: 1000.0000 mg | ORAL_TABLET | ORAL | Status: AC

## 2022-11-18 MED ORDER — CEFAZOLIN SODIUM-DEXTROSE 2-4 GM/100ML-% IV SOLN: 2.0000 g | INTRAVENOUS | Status: DC

## 2022-11-18 MED ORDER — GABAPENTIN 300 MG PO CAPS: 300.0000 mg | ORAL_CAPSULE | ORAL | Status: AC

## 2022-11-18 MED ORDER — LACTATED RINGERS IV SOLN: INTRAVENOUS | Status: DC

## 2022-11-18 MED ORDER — ORAL CARE MOUTH RINSE: 15.0000 mL | Freq: Once | OROMUCOSAL | Status: AC

## 2022-11-19 ENCOUNTER — Ambulatory Visit
Admission: RE | Admit: 2022-11-19 | Discharge: 2022-11-19 | Disposition: A | Discharge status: Home / Self Care | Payer: Medicare HMO | Attending: Surgery | Admitting: Surgery

## 2022-11-19 ENCOUNTER — Other Ambulatory Visit: Payer: Self-pay

## 2022-11-19 ENCOUNTER — Ambulatory Visit
Admission: RE | Admit: 2022-11-19 | Discharge: 2022-11-19 | Disposition: A | Discharge status: Home / Self Care | Payer: Medicare HMO | Source: Ambulatory Visit | Attending: Surgery | Admitting: Surgery

## 2022-11-19 ENCOUNTER — Encounter
Admission: RE | Disposition: A | Discharge status: Home / Self Care | Payer: Self-pay | Source: Home / Self Care | Attending: Surgery

## 2022-11-19 ENCOUNTER — Ambulatory Visit: Payer: Medicare HMO | Admitting: Anesthesiology

## 2022-11-19 ENCOUNTER — Ambulatory Visit: Payer: Medicare HMO | Admitting: Urgent Care

## 2022-11-19 ENCOUNTER — Encounter: Payer: Self-pay | Admitting: Surgery

## 2022-11-19 DIAGNOSIS — R921 Mammographic calcification found on diagnostic imaging of breast: Secondary | ICD-10-CM | POA: Diagnosis present

## 2022-11-19 DIAGNOSIS — Z539 Procedure and treatment not carried out, unspecified reason: Secondary | ICD-10-CM | POA: Insufficient documentation

## 2022-11-19 DIAGNOSIS — Z79899 Other long term (current) drug therapy: Secondary | ICD-10-CM | POA: Insufficient documentation

## 2022-11-19 DIAGNOSIS — I4891 Unspecified atrial fibrillation: Secondary | ICD-10-CM | POA: Insufficient documentation

## 2022-11-19 DIAGNOSIS — I10 Essential (primary) hypertension: Secondary | ICD-10-CM | POA: Insufficient documentation

## 2022-11-19 DIAGNOSIS — R928 Other abnormal and inconclusive findings on diagnostic imaging of breast: Secondary | ICD-10-CM

## 2022-11-19 DIAGNOSIS — Z01818 Encounter for other preprocedural examination: Secondary | ICD-10-CM | POA: Diagnosis not present

## 2022-11-19 DIAGNOSIS — N6081 Other benign mammary dysplasias of right breast: Secondary | ICD-10-CM

## 2022-11-19 SURGERY — CANCELLED PROCEDURE
Anesthesia: General

## 2022-11-19 MED ORDER — METOPROLOL TARTRATE 25 MG PO TABS: 25.0000 mg | ORAL_TABLET | Freq: Two times a day (BID) | ORAL | Status: DC

## 2022-11-19 MED ORDER — CHLORHEXIDINE GLUCONATE 0.12 % MT SOLN
OROMUCOSAL | Status: AC
  Administered 2022-11-19: 15 mL via OROMUCOSAL
  Filled 2022-11-19: qty 15

## 2022-11-19 MED ORDER — GABAPENTIN 300 MG PO CAPS
ORAL_CAPSULE | ORAL | Status: AC
  Administered 2022-11-19: 300 mg via ORAL
  Filled 2022-11-19: qty 1

## 2022-11-19 MED ORDER — METOPROLOL TARTRATE 25 MG PO TABS: 25.0000 mg | ORAL_TABLET | Freq: Two times a day (BID) | ORAL | 2 refills | Status: DC

## 2022-11-19 MED ORDER — BUPIVACAINE HCL (PF) 0.25 % IJ SOLN
INTRAMUSCULAR | Status: AC
  Filled 2022-11-19: qty 30

## 2022-11-19 MED ORDER — PROPOFOL 10 MG/ML IV BOLUS
INTRAVENOUS | Status: AC
  Filled 2022-11-19: qty 20

## 2022-11-19 MED ORDER — FAMOTIDINE 20 MG PO TABS
ORAL_TABLET | ORAL | Status: AC
  Administered 2022-11-19: 20 mg via ORAL
  Filled 2022-11-19: qty 1

## 2022-11-19 MED ORDER — METOPROLOL TARTRATE 25 MG PO TABS
ORAL_TABLET | ORAL | Status: AC
  Administered 2022-11-19: 25 mg via ORAL
  Filled 2022-11-19: qty 1

## 2022-11-19 MED ORDER — MIDAZOLAM HCL 2 MG/2ML IJ SOLN
INTRAMUSCULAR | Status: AC
  Filled 2022-11-19: qty 2

## 2022-11-19 MED ORDER — APIXABAN 5 MG PO TABS: 5.0000 mg | ORAL_TABLET | Freq: Two times a day (BID) | ORAL | 2 refills | Status: DC

## 2022-11-19 MED ORDER — CEFAZOLIN SODIUM-DEXTROSE 2-4 GM/100ML-% IV SOLN
INTRAVENOUS | Status: AC
  Filled 2022-11-19: qty 100

## 2022-11-19 MED ORDER — FENTANYL CITRATE (PF) 100 MCG/2ML IJ SOLN
INTRAMUSCULAR | Status: AC
  Filled 2022-11-19: qty 2

## 2022-11-19 MED ORDER — ACETAMINOPHEN 500 MG PO TABS
ORAL_TABLET | ORAL | Status: AC
  Administered 2022-11-19: 1000 mg via ORAL
  Filled 2022-11-19: qty 2

## 2022-11-19 MED ORDER — APIXABAN 5 MG PO TABS: 5.0000 mg | ORAL_TABLET | Freq: Two times a day (BID) | ORAL | 2 refills | Status: AC

## 2022-11-19 SURGICAL SUPPLY — 37 items
APPLIER CLIP 9.375 SM OPEN (CLIP)
BLADE SURG 15 STRL LF DISP TIS (BLADE) ×1 IMPLANT
BLADE SURG 15 STRL SS (BLADE)
CHLORAPREP W/TINT 26 (MISCELLANEOUS) ×2 IMPLANT
CLIP APPLIE 9.375 SM OPEN (CLIP) IMPLANT
CNTNR URN SCR LID CUP LEK RST (MISCELLANEOUS) IMPLANT
CONT SPEC 4OZ STRL OR WHT (MISCELLANEOUS)
DERMABOND ADVANCED .7 DNX12 (GAUZE/BANDAGES/DRESSINGS) ×1 IMPLANT
DEVICE DUBIN SPECIMEN MAMMOGRA (MISCELLANEOUS) ×2 IMPLANT
DRAPE LAPAROTOMY TRNSV 106X77 (MISCELLANEOUS) ×1 IMPLANT
ELECT CAUTERY BLADE TIP 2.5 (TIP)
ELECT REM PT RETURN 9FT ADLT (ELECTROSURGICAL) ×1
ELECTRODE CAUTERY BLDE TIP 2.5 (TIP) ×1 IMPLANT
ELECTRODE REM PT RTRN 9FT ADLT (ELECTROSURGICAL) ×2 IMPLANT
GAUZE 4X4 16PLY ~~LOC~~+RFID DBL (SPONGE) ×1 IMPLANT
GLOVE ORTHO TXT STRL SZ7.5 (GLOVE) ×1 IMPLANT
GOWN STRL REUS W/ TWL LRG LVL3 (GOWN DISPOSABLE) ×1 IMPLANT
GOWN STRL REUS W/ TWL XL LVL3 (GOWN DISPOSABLE) ×1 IMPLANT
GOWN STRL REUS W/TWL LRG LVL3 (GOWN DISPOSABLE)
GOWN STRL REUS W/TWL XL LVL3 (GOWN DISPOSABLE)
KIT MARKER MARGIN INK (KITS) IMPLANT
KIT TURNOVER KIT A (KITS) ×1 IMPLANT
MANIFOLD NEPTUNE II (INSTRUMENTS) ×2 IMPLANT
NEEDLE HYPO 22GX1.5 SAFETY (NEEDLE) ×1 IMPLANT
PACK BASIN MINOR ARMC (MISCELLANEOUS) ×1 IMPLANT
SET LOCALIZER 20 PROBE US (MISCELLANEOUS) ×1 IMPLANT
SUT MNCRL 4-0 (SUTURE)
SUT MNCRL 4-0 27XMFL (SUTURE)
SUT SILK 2 0 SH (SUTURE) IMPLANT
SUT VIC AB 3-0 SH 27 (SUTURE)
SUT VIC AB 3-0 SH 27X BRD (SUTURE) ×1 IMPLANT
SUTURE MNCRL 4-0 27XMF (SUTURE) ×1 IMPLANT
SYR 20ML LL LF (SYRINGE) ×1 IMPLANT
TRAP FLUID SMOKE EVACUATOR (MISCELLANEOUS) ×1 IMPLANT
TRAP NEPTUNE SPECIMEN COLLECT (MISCELLANEOUS) ×1 IMPLANT
WATER STERILE IRR 1000ML POUR (IV SOLUTION) ×1 IMPLANT
WATER STERILE IRR 500ML POUR (IV SOLUTION) ×1 IMPLANT

## 2022-11-19 NOTE — Progress Notes (Signed)
Pt resting comfortably. Christell Faith PA at bedside. Pt given '25mg'$  Metoprolol. Awaiting Dr. Daniel Nones to see patient before discharge.

## 2022-11-19 NOTE — Anesthesia Preprocedure Evaluation (Signed)
Anesthesia Evaluation  Patient identified by MRN, date of birth, ID band Patient awake    Reviewed: Allergy & Precautions, NPO status , Patient's Chart, lab work & pertinent test results  History of Anesthesia Complications Negative for: history of anesthetic complications  Airway Mallampati: II  TM Distance: >3 FB Neck ROM: Full    Dental no notable dental hx. (+) Teeth Intact   Pulmonary neg pulmonary ROS, neg sleep apnea, neg COPD, Patient abstained from smoking.Not current smoker   Pulmonary exam normal breath sounds clear to auscultation       Cardiovascular Exercise Tolerance: Good METShypertension, Pt. 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 No date: Menorrhagia No date: Seasonal allergies  Reproductive/Obstetrics                              Anesthesia Physical Anesthesia Plan  ASA: 2  Anesthesia Plan: General   Post-op Pain Management: Gabapentin PO (pre-op)* and Tylenol PO (pre-op)*   Induction: Intravenous  PONV Risk Score and Plan: 3 and Ondansetron and Dexamethasone  Airway Management Planned: LMA  Additional Equipment: None  Intra-op Plan:   Post-operative Plan: Extubation in OR  Informed Consent: I have reviewed the patients History and Physical, chart, labs and discussed the procedure including the risks, benefits and alternatives for the proposed anesthesia with the patient or authorized representative who has indicated  his/her understanding and acceptance.     Dental advisory given  Plan Discussed with: CRNA and Surgeon  Anesthesia Plan Comments: (Discussed risks of anesthesia with patient, including PONV, sore throat, lip/dental/eye damage. Rare risks discussed as well, such as cardiorespiratory and neurological sequelae, and allergic reactions. Discussed the role of CRNA in patient's perioperative care. Patient understands.)         Anesthesia Quick Evaluation

## 2022-11-19 NOTE — Interval H&P Note (Signed)
History and Physical Interval Note:  11/19/2022 10:50 AM  Janet Scott  has presented today for surgery, with the diagnosis of flat epithelial atypia right breast.  The various methods of treatment have been discussed with the patient and family. After consideration of risks, benefits and other options for treatment, the patient has consented to  Procedure(s): BREAST BIOPSY WITH RADIO FREQUENCY LOCALIZER (Right) as a surgical intervention.  The patient's history has been reviewed, patient examined, no change in status, stable for surgery.  I have reviewed the patient's chart and labs.  Questions were answered to the patient's satisfaction.   The right breast is marked.   Ronny Bacon

## 2022-11-19 NOTE — Op Note (Signed)
Surgery was canceled due to new onset atrial fibrillation.

## 2022-11-19 NOTE — Progress Notes (Signed)
On hooking patient up to the eKG monitor in the room, prior to any medication administration, it was noted that the rhythm was irregular, suspicious for a-fib on the two leads we had. Patient asymptomatic. After discussion with surgeon, decision made to not proceed with surgery, given that patient had no prior cardiac history, and a normal sinus EKG a few days ago.  I spoke with Dr Daniel Nones by phone, who reviewed the patients chart, and suggested she be discharged and that his office would contact her for close outpatient follow up.   This was relayed to the patient.

## 2022-11-19 NOTE — Consult Note (Signed)
Cardiology Consultation:   Patient ID: Janet Scott; EH:[id number]; 1946/05/27   Admit date: 11/19/2022 Date of Consult: 11/19/2022  Primary Care Provider: [facility] Primary Cardiologist: New - consult by Sabharwal Primary Electrophysiologist:  None   Patient Profile:   Amdanda Scott is a 77 y.o. female with a hx of HTN, breast mass, anemia, cervical spinal fusion with cervical radiculopathy, lumbar stenosis with neurogenic claudication  who is being seen today for the evaluation of new onset Afib at the request of Dr. Bertell Maria.  History of Present Illness:   Ms. Diotte has no previously known cardiac history.  She presented to [facility] on 10/2022 for planned breast biopsy.  She was noted to be in rate controlled A-fib, and was asymptomatic.  EKG in preop showed Afib with RVR, 104 bpm, low voltage QRS, no acute st/t changes.  Preprocedure EKG showed sinus rhythm.  Given new onset Afib, her case was cancelled.  Labs on 11/13/2022 showed a potassium 3.7, BUN 14, serum creatinine 0.73, Hgb 11.5.  She is without symptoms of angina or cardiac decompensation.  BP stable in the 150s over 80s. At time of cardiology consult, she remains asymptomatic and in Afib with ventricular rates in the 90s to low 100s bpm.  She is seen with multiple family members at bedside. They have noted some exertional fatigue in the patient over the past several months. The patient denies any symptoms of angina, palpitations, dizziness, presyncope, or syncope. No lower extremity swelling, abdominal distension, orthopnea, or early satiety. She did have one mechanical fall around Christmas time. No symptoms of hematochezia or melena. She does report a 2 month history of right lower rib discomfort when bending over that her PCP is following.    Past Medical History:  Diagnosis Date   Anemia    Arthritis    Leiomyoma 1993   Menorrhagia    Seasonal allergies     Past Surgical History:  Procedure Laterality Date    ABDOMINAL HYSTERECTOMY  02/1992   Total  ([facility])   BREAST BIOPSY Right 10/01/2022   Stereo Bx, Coil Clip, Path Pending   BREAST BIOPSY Right 10/01/2022   MM RT BREAST BX W LOC DEV 1ST LESION IMAGE BX SPEC STEREO GUIDE 10/01/2022 [facility]   BREAST BIOPSY Right 11/05/2022   MM RT RADIO FREQUENCY TAG LOC MAMMO GUIDE 11/05/2022 [facility]   CERVICAL SPINE SURGERY     DILATION AND CURETTAGE OF UTERUS  1983     Home Meds: Prior to Admission medications   Medication Sig Start Date End Date Taking? Authorizing Provider  acetaminophen (TYLENOL) 325 MG tablet Take by mouth every 6 (six) hours as needed.   Yes [provider]  amLODipine (NORVASC) 2.5 MG tablet Take 2.5 mg by mouth daily. In PM 11/06/21  Yes [provider]  cetirizine (ZYRTEC) 10 MG tablet Take 1 tablet by mouth as needed for allergies.   Yes [provider]  losartan (COZAAR) 25 MG tablet Take 25 mg by mouth daily. In am   Yes [provider]  polyethylene glycol powder (GLYCOLAX/MIRALAX) 17 GM/SCOOP powder Take 1 Container by mouth once.   Yes [provider]  senna (SENOKOT) 8.6 MG TABS tablet Take 1 tablet by mouth daily as needed for mild constipation.   Yes [provider]  aspirin EC 81 MG tablet Take 81 mg by mouth daily. 06/25/21   [provider]  calcium carbonate (TUMS EX) 750 MG chewable tablet Chew 1 tablet by  mouth as needed for heartburn.    [provider]    Inpatient Medications: Scheduled Meds:  bupivacaine liposome  20 mL Infiltration Once   metoprolol tartrate       metoprolol tartrate  25 mg Oral BID   Continuous Infusions:  ceFAZolin      ceFAZolin (ANCEF) IV     lactated ringers 10 mL/hr at 11/19/22 1040   PRN Meds: ceFAZolin, metoprolol tartrate  Allergies:   Allergies  Allergen Reactions   Etodolac Photosensitivity and Rash    Social History:   Social History   Socioeconomic History   Marital status:  Single    Spouse name: Not on file   Number of children: Not on file   Years of education: Not on file   Highest education level: Not on file  Occupational History   Not on file  Tobacco Use   Smoking status: Never   Smokeless tobacco: Never  Vaping Use   Vaping Use: Never used  Substance and Sexual Activity   Alcohol use: No   Drug use: No   Sexual activity: Not on file  Other Topics Concern   Not on file  Social History Narrative   lives alone   Social Determinants of Health   Financial Resource Strain: Not on file  Food Insecurity: Not on file  Transportation Needs: Not on file  Physical Activity: Not on file  Stress: Not on file  Social Connections: Not on file  Intimate Partner Violence: Not on file     Family History:   Family History  Problem Relation Age of Onset   Other Father        "stomach flu"   Cancer Father        oral   Other Brother        suicide   Breast cancer Neg Hx     ROS:  Review of Systems  Constitutional:  Positive for malaise/fatigue. Negative for chills, diaphoresis, fever and weight loss.  HENT:  Negative for congestion.   Eyes:  Negative for discharge and redness.  Respiratory:  Negative for cough, sputum production, shortness of breath and wheezing.   Cardiovascular:  Negative for chest pain, palpitations, orthopnea, claudication, leg swelling and PND.  Gastrointestinal:  Negative for abdominal pain, blood in stool and melena.  Musculoskeletal:  Positive for falls. Negative for myalgias.  Skin:  Negative for rash.  Neurological:  Negative for dizziness, tingling, tremors, sensory change, speech change, focal weakness, loss of consciousness and weakness.  Endo/Heme/Allergies:  Does not bruise/bleed easily.  Psychiatric/Behavioral:  Negative for substance abuse. The patient is not nervous/anxious.   All other systems reviewed and are negative.     Physical Exam/Data:   Vitals:   11/19/22 1215 11/19/22 1220 11/19/22 1225  11/19/22 1230  BP:      Pulse: 97 (!) 102 (!) 110 98  Resp:      Temp:      TempSrc:      SpO2:       No intake or output data in the 24 hours ending 11/19/22 1324 There were no vitals filed for this visit. There is no height or weight on file to calculate BMI.   Physical Exam: General: Well developed, well nourished, in no acute distress. Head: Normocephalic, atraumatic, sclera non-icteric, no xanthomas, nares without discharge.  Neck: Negative for carotid bruits. JVD not elevated. Lungs: Clear bilaterally to auscultation without wheezes, rales, or rhonchi. Breathing is unlabored. Heart: IRIR with S1 S2. No  murmurs, rubs, or gallops appreciated. Abdomen: Soft, non-tender, non-distended with normoactive bowel sounds. No hepatomegaly. No rebound/guarding. No obvious abdominal masses. Msk:  Strength and tone appear normal for age. Extremities: No clubbing or cyanosis. No edema. Distal pedal pulses are 2+ and equal bilaterally. Neuro: Alert and oriented X 3. No facial asymmetry. No focal deficit. Moves all extremities spontaneously. Psych:  Responds to questions appropriately with a normal affect.   EKG:  The EKG was personally reviewed and demonstrates: Afib with RVR, 104 bpm, low voltage QRS, no acute st/t changes Telemetry:  Telemetry was personally reviewed and demonstrates: Afib with ventricular rates in the 90s to low 100s bpm  Weights: There were no vitals filed for this visit.  Relevant CV Studies: None available for review  Laboratory Data:  Chemistry Recent Labs  Lab 11/13/22 1123  NA 133*  K 3.7  CL 98  CO2 26  GLUCOSE 96  BUN 14  CREATININE 0.73  CALCIUM 9.3  GFRNONAA >60  ANIONGAP 9    No results for input(s): "PROT", "ALBUMIN", "AST", "ALT", "ALKPHOS", "BILITOT" in the last 168 hours. Hematology Recent Labs  Lab 11/13/22 1123  WBC 5.1  RBC 3.77*  HGB 11.5*  HCT 34.5*  MCV 91.5  MCH 30.5  MCHC 33.3  RDW 12.3  PLT 252   Cardiac EnzymesNo  results for input(s): "TROPONINI" in the last 168 hours. No results for input(s): "TROPIPOC" in the last 168 hours.  BNPNo results for input(s): "BNP", "PROBNP" in the last 168 hours.  DDimer No results for input(s): "DDIMER" in the last 168 hours.  Radiology/Studies:  No results found.  Assessment and Plan:   1. New onset A-fib:  -Date of onset uncertain, though would have occurred after 11/13/2022 at which time preprocedure EKG showed sinus rhythm  -Asymptomatic  -Add Lopressor 25 mg twice daily  -CHA2DS2-VASc at least 4 (HTN, age x 2, sex category)  -I have reached out to her surgeon and they are okay with [place] initiating apixaban 5 mg twice daily on 11/20/2022, patient does not meet reduced dosing criteria  -Potassium normal  -We will arrange for outpatient follow-up in our office, at which time she will need follow-up labs to ensure stable hemoglobin on anticoagulation as well as TSH  -If she remains in Afib at that time with well controlled ventricular response and is asymptomatic, would pursue DCCV after she has been adequately anticoagulated without missing any doses for a minimum of 4 weeks -If however her ventricular rates are difficult to control, or she becomes symptomatic, she may require TEE/DCCV  -Discussion with family regarding the superiority of DOAC over warfarin -Consider EP evaluation in the office  -Echo is recommended and can be scheduled when we see her in the office -Prescriptions were printed, signed, and given to family member who will pick them up at the pharmacy ([facility] does not accept e-scribing)  2. HTN: -Hold amlodipine -Start Lopressor as above -Monitor    For questions or updates, please contact [facility] Please consult [email or web address] for contact info under Cardiology/STEMI.   Signed, Christell Faith, PA-C [facility] Pager: [phone] 11/19/2022, 1:24 PM

## 2022-11-19 NOTE — Progress Notes (Signed)
Pt seen by Dr. Daniel Nones and ok'd to be discharged. Pt IV removed. Pt taken down to the medical mall entrance via wheelchair where she got in her daughter-in-law's car to be taken home.

## 2022-11-20 ENCOUNTER — Encounter: Payer: Self-pay | Admitting: Surgery

## 2022-11-20 ENCOUNTER — Telehealth: Payer: Self-pay | Admitting: Physician Assistant

## 2022-11-20 NOTE — Telephone Encounter (Signed)
-----   Message from Rise Mu, [place] sent at 11/19/2022  1:45 PM EST ----- Please schedule patient with MD to establish care in 1-2 weeks for hospital follow up for Afib and to discuss cardioversion.

## 2022-11-20 NOTE — Telephone Encounter (Signed)
LVM to schedule hospital fu

## 2022-11-20 NOTE — Telephone Encounter (Signed)
Patient called saying to call daughter Kamalpreet Seman, she is her medical POA at [phone] to schedule appt.  I did call daughter a left her a VM.

## 2022-11-21 ENCOUNTER — Telehealth: Payer: Self-pay | Admitting: Physician Assistant

## 2022-11-21 NOTE — Telephone Encounter (Signed)
Spoke with patients daughter and reviewed provider recommendations for her to stop the metoprolol for now and see if that rash goes away. Confirmed appointment next Friday. She verbalized understanding of recommendations with no further questions at this time.

## 2022-11-21 NOTE — Telephone Encounter (Signed)
Her ventricular rates were reasonably controlled at rest.  It is reasonable to have her hold metoprolol to see if the rash improves.  If possible, would continue apixaban for now.  However, if the rash persists we may need to hold this medication as well.  ED precautions.

## 2022-11-21 NOTE — Telephone Encounter (Signed)
Pt c/o medication issue:  1. Name of Medication: Eliquis and Metoprolol  2. How are you currently taking this medication (dosage and times per day)?   3. Are you having a reaction (difficulty breathing--STAT)?   4. What is your medication issue? Not sure which medicine have caused a rash

## 2022-11-21 NOTE — Telephone Encounter (Signed)
Left message to call back  

## 2022-11-21 NOTE — Telephone Encounter (Signed)
Spoke with daughter per release form. She states patient was recently started on Metoprolol & Eliquis. Patient developed a rash on upper body going down one arm and around her upper chest. She believes her mother has some dementia. She states that patient went to have procedure done and they cancelled because she was in Afib and they started her on Metoprolol and Eliquis. Daughter feels like this is not related but she called because patient is very upset. Daughter states they will try benadryl, hydrocortisone and see if that will help and give [place] a call next week to let us know how she is doing.   Patient has not yet been seen in our office and has appointment next week to be seen as a new patient.

## 2022-11-25 DIAGNOSIS — I4891 Unspecified atrial fibrillation: Secondary | ICD-10-CM | POA: Diagnosis not present

## 2022-11-25 DIAGNOSIS — Z1331 Encounter for screening for depression: Secondary | ICD-10-CM | POA: Diagnosis not present

## 2022-11-25 DIAGNOSIS — Z0130 Encounter for examination of blood pressure without abnormal findings: Secondary | ICD-10-CM | POA: Diagnosis not present

## 2022-11-25 DIAGNOSIS — Z0131 Encounter for examination of blood pressure with abnormal findings: Secondary | ICD-10-CM | POA: Diagnosis not present

## 2022-11-25 DIAGNOSIS — Z1389 Encounter for screening for other disorder: Secondary | ICD-10-CM | POA: Diagnosis not present

## 2022-11-25 DIAGNOSIS — W5581XA Bitten by other mammals, initial encounter: Secondary | ICD-10-CM | POA: Diagnosis not present

## 2022-11-28 ENCOUNTER — Ambulatory Visit: Payer: Medicare HMO | Attending: Cardiology | Admitting: Cardiology

## 2022-11-28 ENCOUNTER — Encounter: Payer: Self-pay | Admitting: Cardiology

## 2022-11-28 VITALS — BP 138/62 | HR 64 | Ht 61.0 in | Wt 149.6 lb

## 2022-11-28 DIAGNOSIS — I10 Essential (primary) hypertension: Secondary | ICD-10-CM

## 2022-11-28 DIAGNOSIS — Z01810 Encounter for preprocedural cardiovascular examination: Secondary | ICD-10-CM | POA: Diagnosis not present

## 2022-11-28 DIAGNOSIS — I480 Paroxysmal atrial fibrillation: Secondary | ICD-10-CM

## 2022-11-28 NOTE — Patient Instructions (Signed)
Medication Instructions:   Your physician recommends that you continue on your current medications as directed. Please refer to the Current Medication list given to you today.  *If you need a refill on your cardiac medications before your next appointment, please call your pharmacy*   Lab Work:  None Ordered  If you have labs (blood work) drawn today and your tests are completely normal, you will receive your results only by: MyChart Message (if you have MyChart) OR A paper copy in the mail If you have any lab test that is abnormal or we need to change your treatment, we will call you to review the results.   Testing/Procedures:  Your physician has requested that you have an echocardiogram. Echocardiography is a painless test that uses sound waves to create images of your heart. It provides your doctor with information about the size and shape of your heart and how well your heart's chambers and valves are working. This procedure takes approximately one hour. There are no restrictions for this procedure. Please do NOT wear cologne, perfume, aftershave, or lotions (deodorant is allowed). Please arrive 15 minutes prior to your appointment time.    Follow-Up: At [facility], you and your health needs are our priority.  As part of our continuing mission to provide you with exceptional heart care, we have created designated Provider Care Teams.  These Care Teams include your primary Cardiologist (physician) and Advanced Practice Providers (APPs -  Physician Assistants and Nurse Practitioners) who all work together to provide you with the care you need, when you need it.  We recommend signing up for the patient portal called "MyChart".  Sign up information is provided on this After Visit Summary.  MyChart is used to connect with patients for Virtual Visits (Telemedicine).  Patients are able to view lab/test results, encounter notes, upcoming appointments, etc.  Non-urgent messages can  be sent to your provider as well.   To learn more about what you can do with MyChart, go to [email or web address].    Your next appointment:    After Echocardiogram  Provider:   You may see Brian Agbor-Etang, MD or one of the following Advanced Practice Providers on your designated Care Team:   Christopher Berge, NP Ryan Dunn, PA-C Cadence Furth, PA-C Sheri Hammock, NP 

## 2022-11-28 NOTE — Progress Notes (Signed)
Cardiology Office Note:    Date:  11/28/2022   ID:  Janet Scott, Janet Scott 25-Feb-1946, MRN [id number]  PCP:  [facility], [place] [facility] Cardiologist:  Kate Sable, MD     Referring MD: [facility], [place]   Chief Complaint  Patient presents with   Follow-up    Follow up visit from hospital for Afib. Patient states that she feels good today. Meds reviewed with patient.     History of Present Illness:    Janet Scott is a 77 y.o. female with a hx of hypertension, persistent A-fib who presents for follow-up.  Evaluated in the hospital about a week ago for a breast biopsy, preprocedural EKG showed atrial fibrillation heart rate 104.  Patient was started on Eliquis and Lopressor.  Denies chest pain or palpitations.  Denies dizziness, denies any history of stroke.  Compliant with Eliquis as prescribed, denies any bleeding issues.    Past Medical History:  Diagnosis Date   Anemia    Arthritis    Leiomyoma 1993   Menorrhagia    Seasonal allergies     Past Surgical History:  Procedure Laterality Date   ABDOMINAL HYSTERECTOMY  02/1992   Total  ([facility])   BREAST BIOPSY Right 10/01/2022   Stereo Bx, Coil Clip, Path Pending   BREAST BIOPSY Right 10/01/2022   MM RT BREAST BX W LOC DEV 1ST LESION IMAGE BX SPEC STEREO GUIDE 10/01/2022 [facility]   BREAST BIOPSY Right 11/05/2022   MM RT RADIO FREQUENCY TAG LOC MAMMO GUIDE 11/05/2022 [facility]   CERVICAL SPINE SURGERY     DILATION AND CURETTAGE OF UTERUS  1983    Current Medications: Current Meds  Medication Sig   acetaminophen (TYLENOL) 325 MG tablet Take by mouth every 6 (six) hours as needed.   apixaban (ELIQUIS) 5 MG TABS tablet Take 1 tablet (5 mg total) by mouth 2 (two) times daily.   calcium carbonate (TUMS EX) 750 MG chewable tablet Chew 1 tablet by mouth as needed for heartburn.   cetirizine (ZYRTEC) 10 MG tablet Take 1 tablet by mouth as needed for allergies.    metoprolol tartrate (LOPRESSOR) 25 MG tablet Take 25 mg by mouth 2 (two) times daily.   polyethylene glycol powder (GLYCOLAX/MIRALAX) 17 GM/SCOOP powder Take 1 Container by mouth as needed.   senna (SENOKOT) 8.6 MG TABS tablet Take 1 tablet by mouth daily as needed for mild constipation.     Allergies:   Etodolac   Social History   Socioeconomic History   Marital status: Single    Spouse name: Not on file   Number of children: Not on file   Years of education: Not on file   Highest education level: Not on file  Occupational History   Not on file  Tobacco Use   Smoking status: Never   Smokeless tobacco: Never  Vaping Use   Vaping Use: Never used  Substance and Sexual Activity   Alcohol use: No   Drug use: No   Sexual activity: Not on file  Other Topics Concern   Not on file  Social History Narrative   lives alone   Social Determinants of Health   Financial Resource Strain: Not on file  Food Insecurity: Not on file  Transportation Needs: Not on file  Physical Activity: Not on file  Stress: Not on file  Social Connections: Not on file     Family History: The patient's family history includes Cancer in her father;  Other in her brother and father. There is no history of Breast cancer.  ROS:   Please see the history of present illness.     All other systems reviewed and are negative.  EKGs/Labs/Other Studies Reviewed:    The following studies were reviewed today:   EKG:  EKG is  ordered today.  The ekg ordered today demonstrates normal sinus rhythm, heart rate 64  Recent Labs: 11/13/2022: BUN 14; Creatinine, Ser 0.73; Hemoglobin 11.5; Platelets 252; Potassium 3.7; Sodium 133  Recent Lipid Panel No results found for: "CHOL", "TRIG", "HDL", "CHOLHDL", "VLDL", "LDLCALC", "LDLDIRECT"   Risk Assessment/Calculations:             Physical Exam:    VS:  BP 138/62 (BP Location: Left Arm, Patient Position: Sitting, Cuff Size: Normal)   Pulse 64   Ht '5\' 1"'$  (1.549  m)   Wt 149 lb 9.6 oz (67.9 kg)   SpO2 95%   BMI 28.27 kg/m     Wt Readings from Last 3 Encounters:  11/28/22 149 lb 9.6 oz (67.9 kg)  10/30/22 148 lb 12.8 oz (67.5 kg)  11/11/21 162 lb 14.7 oz (73.9 kg)     GEN:  Well nourished, well developed in no acute distress HEENT: Normal NECK: No JVD; No carotid bruits CARDIAC: RRR, no murmurs, rubs, gallops RESPIRATORY:  Clear to auscultation without rales, wheezing or rhonchi  ABDOMEN: Soft, non-tender, non-distended MUSCULOSKELETAL:  No edema; No deformity  SKIN: Warm and dry NEUROLOGIC:  Alert and oriented x 3 PSYCHIATRIC:  Normal affect   ASSESSMENT:    1. Paroxysmal atrial fibrillation (HCC)   2. Primary hypertension   3. Pre-procedural cardiovascular examination    PLAN:    In order of problems listed above:  Paroxysmal atrial fibrillation, CHA2DS2-VASc score 4.  Continue Eliquis, Lopressor.  Patient clinically asymptomatic.  Get echocardiogram. Hypertension, BP controlled.  Continue Lopressor 25 mg twice daily. Preprocedural exam, breast biopsy being planned.  Obtain echo as above.  Okay to proceed with biopsy if no significant structural abnormalities on echo.  Patient has no symptoms.  Okay to hold Eliquis 48 hours prior to procedure.  Restart Eliquis when safely possible after procedure.  Follow-up after echocardiogram      Medication Adjustments/Labs and Tests Ordered: Current medicines are reviewed at length with the patient today.  Concerns regarding medicines are outlined above.  Orders Placed This Encounter  Procedures   EKG 12-Lead   ECHOCARDIOGRAM COMPLETE   No orders of the defined types were placed in this encounter.   Patient Instructions  Medication Instructions:   Your physician recommends that you continue on your current medications as directed. Please refer to the Current Medication list given to you today.  *If you need a refill on your cardiac medications before your next appointment, please  call your pharmacy*   Lab Work:  None Ordered  If you have labs (blood work) drawn today and your tests are completely normal, you will receive your results only by: [place] (if you have MyChart) OR A paper copy in the mail If you have any lab test that is abnormal or we need to change your treatment, we will call you to review the results.   Testing/Procedures:  Your physician has requested that you have an echocardiogram. Echocardiography is a painless test that uses sound waves to create images of your heart. It provides your doctor with information about the size and shape of your heart and how well your heart's chambers and  valves are working. This procedure takes approximately one hour. There are no restrictions for this procedure. Please do NOT wear cologne, perfume, aftershave, or lotions (deodorant is allowed). Please arrive 15 minutes prior to your appointment time.    Follow-Up: At [facility], you and your health needs are our priority.  As part of our continuing mission to provide you with exceptional heart care, we have created designated Provider Care Teams.  These Care Teams include your primary Cardiologist (physician) and Advanced Practice Providers (APPs -  Physician Assistants and Nurse Practitioners) who all work together to provide you with the care you need, when you need it.  We recommend signing up for the patient portal called "MyChart".  Sign up information is provided on this After Visit Summary.  MyChart is used to connect with patients for Virtual Visits (Telemedicine).  Patients are able to view lab/test results, encounter notes, upcoming appointments, etc.  Non-urgent messages can be sent to your provider as well.   To learn more about what you can do with MyChart, go to [email or web address].    Your next appointment:    After Echocardiogram   Provider:   You may see Kate Sable, MD or one of the following Advanced Practice  Providers on your designated Care Team:   Murray Hodgkins, NP Christell Faith, PA-C Cadence Kathlen Mody, PA-C Gerrie Nordmann, NP   Signed, Kate Sable, MD  11/28/2022 4:45 PM    [place]

## 2022-12-08 ENCOUNTER — Encounter: Payer: Self-pay | Admitting: Surgery

## 2022-12-30 DIAGNOSIS — I480 Paroxysmal atrial fibrillation: Secondary | ICD-10-CM | POA: Diagnosis not present

## 2022-12-30 DIAGNOSIS — Z0130 Encounter for examination of blood pressure without abnormal findings: Secondary | ICD-10-CM | POA: Diagnosis not present

## 2022-12-30 DIAGNOSIS — R928 Other abnormal and inconclusive findings on diagnostic imaging of breast: Secondary | ICD-10-CM | POA: Diagnosis not present

## 2022-12-30 DIAGNOSIS — I10 Essential (primary) hypertension: Secondary | ICD-10-CM | POA: Diagnosis not present

## 2022-12-30 DIAGNOSIS — Z1389 Encounter for screening for other disorder: Secondary | ICD-10-CM | POA: Diagnosis not present

## 2023-01-12 ENCOUNTER — Ambulatory Visit: Payer: Medicare HMO | Attending: Cardiology

## 2023-01-12 DIAGNOSIS — I480 Paroxysmal atrial fibrillation: Secondary | ICD-10-CM | POA: Diagnosis not present

## 2023-01-12 DIAGNOSIS — Z01810 Encounter for preprocedural cardiovascular examination: Secondary | ICD-10-CM

## 2023-01-12 LAB — ECHOCARDIOGRAM COMPLETE
AR max vel: 2.66 cm2
AV Area VTI: 2.46 cm2
AV Area mean vel: 2.56 cm2
AV Mean grad: 4 mmHg
AV Peak grad: 7 mmHg
Ao pk vel: 1.32 m/s
Area-P 1/2: 3.53 cm2
S' Lateral: 2.4 cm

## 2023-01-14 ENCOUNTER — Encounter: Payer: Self-pay | Admitting: Cardiology

## 2023-01-14 ENCOUNTER — Ambulatory Visit: Payer: Medicare HMO | Attending: Cardiology | Admitting: Cardiology

## 2023-01-14 VITALS — BP 153/84 | HR 56 | Ht 61.0 in | Wt 150.0 lb

## 2023-01-14 DIAGNOSIS — Z01810 Encounter for preprocedural cardiovascular examination: Secondary | ICD-10-CM | POA: Diagnosis not present

## 2023-01-14 DIAGNOSIS — I10 Essential (primary) hypertension: Secondary | ICD-10-CM

## 2023-01-14 DIAGNOSIS — I480 Paroxysmal atrial fibrillation: Secondary | ICD-10-CM

## 2023-01-14 MED ORDER — DILTIAZEM HCL ER COATED BEADS 120 MG PO CP24: 120.0000 mg | ORAL_CAPSULE | Freq: Every day | ORAL | 3 refills | Status: DC

## 2023-01-14 NOTE — Patient Instructions (Signed)
Medication Instructions:   STOP Lopressor START Cardizem CD - Take one tablet ( ) by mouth daily.   *If you need a refill on your cardiac medications before your next appointment, please call your pharmacy*   Lab Work:  1 None Ordered  If you have labs (blood work) drawn today and your tests are completely normal, you will receive your results only by: MyChart Message (if you have MyChart) OR A paper copy in the mail If you have any lab test that is abnormal or we need to change your treatment, we will call you to review the results.   Testing/Procedures:  None Ordered    Follow-Up: At [facility], you and your health needs are our priority.  As part of our continuing mission to provide you with exceptional heart care, we have created designated Provider Care Teams.  These Care Teams include your primary Cardiologist (physician) and Advanced Practice Providers (APPs -  Physician Assistants and Nurse Practitioners) who all work together to provide you with the care you need, when you need it.  We recommend signing up for the patient portal called "MyChart".  Sign up information is provided on this After Visit Summary.  MyChart is used to connect with patients for Virtual Visits (Telemedicine).  Patients are able to view lab/test results, encounter notes, upcoming appointments, etc.  Non-urgent messages can be sent to your provider as well.   To learn more about what you can do with MyChart, go to [email or web address].    Your next appointment:   12 month(s)  Provider:   You may see Debbe Odea, MD or one of the following Advanced Practice Providers on your designated Care Team:   Nicolasa Ducking, NP Eula Listen, PA-C Cadence Fransico Michael, PA-C Charlsie Quest, NP

## 2023-01-14 NOTE — Progress Notes (Signed)
Cardiology Office Note:    Date:  01/14/2023   ID:  Tosca, Pletz [birth year], MRN [id number]  PCP:  [facility]   [facility] Prov[facility] Cardiologist:  Debbe Odea, MD     Referring MD: [facility]   No chief complaint on file.   History of Present Illness:    Janet Scott is a 77 y.o. female with a hx of hypertension, paroxysmal A-fib who presents for follow-up.  Being seen for persistent atrial fibrillation, echocardiogram was ordered to evaluate any structural abnormalities.  She denies chest pain, palpitations.  States having itchiness and vivid dreams when taking metoprolol.  Denies any bleeding issues with Eliquis.  Planning on having a breast biopsy in the near future.   Past Medical History:  Diagnosis Date   Anemia    Arthritis    Leiomyoma 1993   Menorrhagia    Seasonal allergies     Past Surgical History:  Procedure Laterality Date   ABDOMINAL HYSTERECTOMY  02/1992   Total  ([facility])   BREAST BIOPSY Right 10/01/2022   Stereo Bx, Coil Clip, Path Pending   BREAST BIOPSY Right 10/01/2022   MM RT BREAST BX W LOC DEV 1ST LESION IMAGE BX SPEC STEREO GUIDE 10/01/2022 [facility]   BREAST BIOPSY Right 11/05/2022   MM RT RADIO FREQUENCY TAG LOC MAMMO GUIDE 11/05/2022 [facility]   CERVICAL SPINE SURGERY     DILATION AND CURETTAGE OF UTERUS  1983    Current Medications: Current Meds  Medication Sig   acetaminophen (TYLENOL) 325 MG tablet Take by mouth every 6 (six) hours as needed.   apixaban (ELIQUIS) 5 MG TABS tablet Take 1 tablet (5 mg total) by mouth 2 (two) times daily.   calcium carbonate (TUMS EX) 750 MG chewable tablet Chew 1 tablet by mouth as needed for heartburn.   cetirizine (ZYRTEC) 10 MG tablet Take 1 tablet by mouth as needed for allergies.   polyethylene glycol powder (GLYCOLAX/MIRALAX) 17 GM/SCOOP powder Take 1 Container by mouth as needed.   senna (SENOKOT) 8.6 MG TABS tablet Take 1  tablet by mouth daily as needed for mild constipation.   [DISCONTINUED] diltiazem (CARDIZEM CD) 120 MG 24 hr capsule Take 1 capsule (120 mg total) by mouth daily.   [DISCONTINUED] metoprolol tartrate (LOPRESSOR) 25 MG tablet Take 25 mg by mouth 2 (two) times daily.     Allergies:   Etodolac   Social History   Socioeconomic History   Marital status: Single    Spouse name: Not on file   Number of children: Not on file   Years of education: Not on file   Highest education level: Not on file  Occupational History   Not on file  Tobacco Use   Smoking status: Never   Smokeless tobacco: Never  Vaping Use   Vaping Use: Never used  Substance and Sexual Activity   Alcohol use: No   Drug use: No   Sexual activity: Not on file  Other Topics Concern   Not on file  Social History Narrative   lives alone   Social Determinants of Health   Financial Resource Strain: Not on file  Food Insecurity: Not on file  Transportation Needs: Not on file  Physical Activity: Not on file  Stress: Not on file  Social Connections: Not on file     Family History: The patient's family history includes Cancer in her father; Other in her brother and father. There is no history of  Breast cancer.  ROS:   Please see the history of present illness.     All other systems reviewed and are negative.  EKGs/Labs/Other Studies Reviewed:    The following studies were reviewed today:   EKG:  EKG not ordered today.    Recent Labs: 11/13/2022: BUN 14; Creatinine, Ser 0.73; Hemoglobin 11.5; Platelets 252; Potassium 3.7; Sodium 133  Recent Lipid Panel No results found for: "CHOL", "TRIG", "HDL", "CHOLHDL", "VLDL", "LDLCALC", "LDLDIRECT"   Risk Assessment/Calculations:        Physical Exam:    VS:  BP (!) 153/84 (BP Location: Left Arm, Patient Position: Sitting, Cuff Size: Normal)   Pulse (!) 56   Ht  (1.549 m)   Wt 150 lb (68 kg)   SpO2 97%   BMI 28.34 kg/m     Wt Readings from Last 3  Encounters:  01/14/23 150 lb (68 kg)  11/28/22 149 lb 9.6 oz (67.9 kg)  10/30/22 148 lb 12.8 oz (67.5 kg)     GEN:  Well nourished, well developed in no acute distress HEENT: Normal NECK: No JVD; No carotid bruits CARDIAC: Regular rate and rhythm RESPIRATORY:  Clear to auscultation without rales, wheezing or rhonchi  ABDOMEN: Soft, non-tender, non-distended MUSCULOSKELETAL:  No edema; No deformity  SKIN: Warm and dry NEUROLOGIC:  Alert and oriented x 3 PSYCHIATRIC:  Normal affect   ASSESSMENT:    1. Paroxysmal atrial fibrillation   2. Primary hypertension   3. Pre-procedural cardiovascular examination    PLAN:    In order of problems listed above:  Paroxysmal atrial fibrillation, CHA2DS2-VASc score 4.  Rhythm appears regular on exam.  Continue Eliquis, start Cardizem.  Stop Lopressor due to itching..  Patient clinically asymptomatic.  Echo with EF 60 to 65%, moderately dilated LA. referred to the A-fib clinic. Hypertension, Cardizem 120 mg daily.  Stop Lopressor due to itching. Preprocedural exam, breast biopsy being planned, okay for biopsy from a cardiac perspective.  Okay to hold Eliquis 48 hours prior to procedure.  Restart as soon as safely possible after.  Follow-up yearly.      Medication Adjustments/Labs and Tests Ordered: Current medicines are reviewed at length with the patient today.  Concerns regarding medicines are outlined above.  Orders Placed This Encounter  Procedures   Ambulatory referral to Cardiac Electrophysiology   Meds ordered this encounter  Medications   DISCONTD: diltiazem (CARDIZEM CD) 120 MG 24 hr capsule    Sig: Take 1 capsule (120 mg total) by mouth daily.    Dispense:  90 capsule    Refill:  3   diltiazem (CARDIZEM CD) 120 MG 24 hr capsule    Sig: Take 1 capsule (120 mg total) by mouth daily.    Dispense:  90 capsule    Refill:  3    Patient Instructions  Medication Instructions:   STOP Lopressor START Cardizem CD - Take one  tablet ( ) by mouth daily.   *If you need a refill on your cardiac medications before your next appointment, please call your pharmacy*   Lab Work:  1 None Ordered  If you have labs (blood work) drawn today and your tests are completely normal, you will receive your results only by: MyChart Message (if you have MyChart) OR A paper copy in the mail If you have any lab test that is abnormal or we need to change your treatment, we will call you to review the results.   Testing/Procedures:  None Ordered    Follow-Up:  At [facility], you and your health needs are our priority.  As part of our continuing mission to provide you with exceptional heart care, we have created designated Provider Care Teams.  These Care Teams include your primary Cardiologist (physician) and Advanced Practice Providers (APPs -  Physician Assistants and Nurse Practitioners) who all work together to provide you with the care you need, when you need it.  We recommend signing up for the patient portal called "MyChart".  Sign up information is provided on this After Visit Summary.  MyChart is used to connect with patients for Virtual Visits (Telemedicine).  Patients are able to view lab/test results, encounter notes, upcoming appointments, etc.  Non-urgent messages can be sent to your provider as well.   To learn more about what you can do with MyChart, go to [email or web address].    Your next appointment:   12 month(s)  Provider:   You may see Debbe Odea, MD or one of the following Advanced Practice Providers on your designated Care Team:   Nicolasa Ducking, NP Eula Listen, PA-C Cadence Fransico Michael, PA-C Charlsie Quest, NP   Signed, Debbe Odea, MD  01/14/2023 11:05 AM    [facility]

## 2023-02-27 ENCOUNTER — Telehealth: Payer: Self-pay | Admitting: Cardiology

## 2023-02-27 NOTE — Telephone Encounter (Signed)
Pt c/o medication issue:  1. Name of Medication: diltiazem (CARDIZEM CD) 120 MG 24 hr capsule   2. How are you currently taking this medication (dosage and times per day)? Take 1 capsule (120 mg total) by mouth daily.   3. Are you having a reaction (difficulty breathing--STAT)? no  4. What is your medication issue? Patient states she is having a rash around her neck and down under arms and legs.  Her hand her red and itchy when she takes the this medicine.  She states she took a benadryl because it was so bad.

## 2023-03-02 MED ORDER — VERAPAMIL HCL ER 120 MG PO TBCR: 120.0000 mg | EXTENDED_RELEASE_TABLET | Freq: Every day | ORAL | 3 refills | Status: DC

## 2023-03-02 NOTE — Telephone Encounter (Signed)
Spoke to patient and informed her of the provider's recommendations as follows:  "Okay to stop Cardizem, start verapamil 120 mg daily."  Patient understood with read back

## 2023-03-05 ENCOUNTER — Other Ambulatory Visit: Payer: Self-pay

## 2023-03-05 ENCOUNTER — Telehealth: Payer: Self-pay | Admitting: Cardiology

## 2023-03-05 MED ORDER — VERAPAMIL HCL ER 120 MG PO TBCR: 120.0000 mg | EXTENDED_RELEASE_TABLET | Freq: Every day | ORAL | 1 refills | Status: DC

## 2023-03-05 NOTE — Telephone Encounter (Signed)
Requested Prescriptions   Signed Prescriptions Disp Refills   verapamil (CALAN-SR) 120 MG CR tablet 90 tablet 1    Sig: Take 1 tablet (120 mg total) by mouth at bedtime.    Authorizing Provider: Debbe Odea    Ordering User: Thayer Headings, Lenore Moyano L

## 2023-03-05 NOTE — Telephone Encounter (Signed)
*  STAT* If patient is at the pharmacy, call can be transferred to refill team.   1. Which medications need to be refilled? (please list name of each medication and dose if known)    verapamil (CALAN-SR) 120 MG CR tablet    2. Which pharmacy/location (including street and city if local pharmacy) is medication to be sent to?   [facility] Phone: [phone]  Fax: [phone]      3. Do they need a 30 day or 90 day supply? 90

## 2023-03-11 ENCOUNTER — Institutional Professional Consult (permissible substitution): Payer: Medicare HMO | Admitting: Cardiology

## 2023-03-18 ENCOUNTER — Institutional Professional Consult (permissible substitution): Payer: Medicare HMO | Admitting: Cardiology

## 2023-03-18 NOTE — Progress Notes (Unsigned)
Electrophysiology Office Note:    Date:  03/19/2023   ID:  Janet Scott, DOB [birth year], MRN [id number]  [facility] Cardiologist:  Debbe Odea, MD  [facility] Electrophysiologist:  Lanier Prude, MD   Referring MD: Debbe Odea, MD   Chief Complaint: Atrial fibrillation  History of Present Illness:    Janet Scott is a 77 y.o. femalewho I am seeing today for an evaluation of atrial fibrillation at the request of Dr. Azucena Cecil.  The patient was last seen by Dr. Azucena Cecil on January 14, 2023.  The patient has a medical history that includes arthritis and hypertension.  The patient was diagnosed with atrial fibrillation in February of this year when she presented for a breast biopsy.  At that time she was in rate controlled atrial fibrillation.  She reported fatigue over the last few months that she attributed to possible atrial fibrillation.  Rhythm control was recommended and discussions were planned for the outpatient setting by Dr. Gasper Lloyd.     Their past medical, social and family history was reveiwed.   ROS:   Please see the history of present illness.    All other systems reviewed and are negative.  EKGs/Labs/Other Studies Reviewed:    The following studies were reviewed today:  January 12, 2023 echo EF 60-65 RV normal Moderately dilated left atrium Trivial MR  November 28, 2022 EKG shows sinus rhythm November 13, 2022 EKG shows sinus rhythm November 19, 2022 EKG shows atrial fibrillation with a ventricular rate of 104 bpm and a low voltage QRS  EKG Interpretation Date/Time:  Thursday March 19 2023 12:08:51 EDT Ventricular Rate:  117 PR Interval:    QRS Duration:  56 QT Interval:  320 QTC Calculation: 446 R Axis:   56  Text Interpretation: Atrial fibrillation with rapid ventricular response Confirmed by Steffanie Dunn [phone]) on 03/19/2023 10:21:18 PM    Physical Exam:    VS:  BP (!) 160/82   Pulse (!) 117   Ht 5\' 1"  (1.549 m)    Wt 147 lb 9.6 oz (67 kg)   SpO2 99%   BMI 27.89 kg/m     Wt Readings from Last 3 Encounters:  03/19/23 147 lb 9.6 oz (67 kg)  01/14/23 150 lb (68 kg)  11/28/22 149 lb 9.6 oz (67.9 kg)     GEN:  Well nourished, well developed in no acute distress CARDIAC: irregularly irregular, no murmurs, rubs, gallops RESPIRATORY:  Clear to auscultation without rales, wheezing or rhonchi       ASSESSMENT AND PLAN:    1. Paroxysmal atrial fibrillation (HCC)   2. Primary hypertension     #Paroxysmal atrial fibrillation Symptomatic, associated with fatigue.  On Eliquis for stroke prophylaxis, continue.  Discussed treatment options for her atrial fibrillation including antiarrhythmic drug therapy, conservative/continued watchful waiting and catheter ablation.  She would like to avoid medications if at all possible which is very reasonable. She would like to pursue rhythm control. I will increase her verapamil today to improve her rate control.  Discussed treatment options today for AF including antiarrhythmic drug therapy and ablation. Discussed risks, recovery and likelihood of success with each treatment strategy. Risk, benefits, and alternatives to EP study and radiofrequency ablation for afib were discussed. These risks include but are not limited to stroke, bleeding, vascular damage, tamponade, perforation, damage to the esophagus, lungs, phrenic nerve and other structures, pulmonary vein stenosis, worsening renal function, and death.  Discussed potential need for repeat ablation procedures and antiarrhythmic drugs  after an initial ablation. The patient understands these risk and wishes to proceed.  We will therefore proceed with catheter ablation at the next available time.  Carto, ICE, anesthesia are requested for the procedure.  Will also obtain CT PV protocol prior to the procedure to exclude LAA thrombus and further evaluate atrial anatomy.   #Hypertension Above goal today.  Recommend  checking blood pressures 1-2 times per week at home and recording the values.  Recommend bringing these recordings to the primary care physician.       Signed, Rossie Muskrat. Lalla Brothers, MD, [facility], [facility] 03/19/2023 10:25 PM    Electrophysiology [facility]

## 2023-03-19 ENCOUNTER — Ambulatory Visit: Payer: Medicare HMO | Attending: Cardiology | Admitting: Cardiology

## 2023-03-19 ENCOUNTER — Encounter: Payer: Self-pay | Admitting: Cardiology

## 2023-03-19 VITALS — BP 160/82 | HR 117 | Ht 61.0 in | Wt 147.6 lb

## 2023-03-19 DIAGNOSIS — I480 Paroxysmal atrial fibrillation: Secondary | ICD-10-CM | POA: Diagnosis not present

## 2023-03-19 DIAGNOSIS — Z0131 Encounter for examination of blood pressure with abnormal findings: Secondary | ICD-10-CM | POA: Diagnosis not present

## 2023-03-19 DIAGNOSIS — Z1389 Encounter for screening for other disorder: Secondary | ICD-10-CM | POA: Diagnosis not present

## 2023-03-19 DIAGNOSIS — E669 Obesity, unspecified: Secondary | ICD-10-CM | POA: Diagnosis not present

## 2023-03-19 DIAGNOSIS — I10 Essential (primary) hypertension: Secondary | ICD-10-CM

## 2023-03-19 DIAGNOSIS — Z0130 Encounter for examination of blood pressure without abnormal findings: Secondary | ICD-10-CM | POA: Diagnosis not present

## 2023-03-19 MED ORDER — VERAPAMIL HCL ER 120 MG PO TBCR: 120.0000 mg | EXTENDED_RELEASE_TABLET | Freq: Two times a day (BID) | ORAL | 3 refills | Status: DC

## 2023-03-19 NOTE — Patient Instructions (Addendum)
Medication Instructions:  Your physician has recommended you make the following change in your medication:  1) INCREASE verapamil to 120 mg twice daily *If you need a refill on your cardiac medications before your next appointment, please call your pharmacy*  Lab Work: BMET and CBC prior to CT scan and ablation You will get your lab work at [facility]) hospital.  Your lab work will be done at [facility] next to [employer].  These are walk in labs- you will not need an appointment and you do not need to be fasting.    Testing/Procedures: Your physician has requested that you have cardiac CT. Cardiac computed tomography (CT) is a painless test that uses an x-ray machine to take clear, detailed pictures of your heart. This will be done one week prior to your ablation. We will call you to schedule.   Your physician has recommended that you have an ablation. Catheter ablation is a medical procedure used to treat some cardiac arrhythmias (irregular heartbeats). During catheter ablation, a long, thin, flexible tube is put into a blood vessel in your groin (upper thigh), or neck. This tube is called an ablation catheter. It is then guided to your heart through the blood vessel. Radio frequency waves destroy small areas of heart tissue where abnormal heartbeats may cause an arrhythmia to start. Please see the instruction sheet given to you today. You are scheduled for Atrial Fibrillation Ablation on Thursday, November 14 with Dr. Steffanie Dunn.Please arrive at the Main Entrance A at [facility]: [address] [place], [place] [address] at 8:30 AM    Follow-Up: At [facility], you and your health needs are our priority.  As part of our continuing mission to provide you with exceptional heart care, we have created designated Provider Care Teams.  These Care Teams include your primary Cardiologist (physician) and Advanced Practice Providers (APPs -  Physician  Assistants and Nurse Practitioners) who all work together to provide you with the care you need, when you need it.  Your next appointment:   We will call you to schedule your follow up appointments.

## 2023-03-30 DIAGNOSIS — H5213 Myopia, bilateral: Secondary | ICD-10-CM | POA: Diagnosis not present

## 2023-03-30 DIAGNOSIS — Z0100 Encounter for examination of eyes and vision without abnormal findings: Secondary | ICD-10-CM | POA: Diagnosis not present

## 2023-04-24 DIAGNOSIS — I480 Paroxysmal atrial fibrillation: Secondary | ICD-10-CM | POA: Diagnosis not present

## 2023-04-24 DIAGNOSIS — Z1389 Encounter for screening for other disorder: Secondary | ICD-10-CM | POA: Diagnosis not present

## 2023-04-24 DIAGNOSIS — Z0130 Encounter for examination of blood pressure without abnormal findings: Secondary | ICD-10-CM | POA: Diagnosis not present

## 2023-04-24 DIAGNOSIS — Z0131 Encounter for examination of blood pressure with abnormal findings: Secondary | ICD-10-CM | POA: Diagnosis not present

## 2023-04-24 DIAGNOSIS — R928 Other abnormal and inconclusive findings on diagnostic imaging of breast: Secondary | ICD-10-CM | POA: Diagnosis not present

## 2023-04-24 DIAGNOSIS — I10 Essential (primary) hypertension: Secondary | ICD-10-CM | POA: Diagnosis not present

## 2023-07-13 ENCOUNTER — Other Ambulatory Visit
Admission: RE | Admit: 2023-07-13 | Discharge: 2023-07-13 | Disposition: A | Discharge status: Home / Self Care | Payer: Medicare HMO | Source: Ambulatory Visit | Attending: Cardiology | Admitting: Cardiology

## 2023-07-13 DIAGNOSIS — I10 Essential (primary) hypertension: Secondary | ICD-10-CM | POA: Insufficient documentation

## 2023-07-13 DIAGNOSIS — I480 Paroxysmal atrial fibrillation: Secondary | ICD-10-CM | POA: Insufficient documentation

## 2023-07-13 LAB — CBC WITH DIFFERENTIAL/PLATELET
Abs Immature Granulocytes: 0.03 10*3/uL (ref 0.00–0.07)
Basophils Absolute: 0.1 10*3/uL (ref 0.0–0.1)
Basophils Relative: 1 %
Eosinophils Absolute: 0.5 10*3/uL (ref 0.0–0.5)
Eosinophils Relative: 7 %
HCT: 36.4 % (ref 36.0–46.0)
Hemoglobin: 12.4 g/dL (ref 12.0–15.0)
Immature Granulocytes: 0 %
Lymphocytes Relative: 17 %
Lymphs Abs: 1.2 10*3/uL (ref 0.7–4.0)
MCH: 30 pg (ref 26.0–34.0)
MCHC: 34.1 g/dL (ref 30.0–36.0)
MCV: 88.1 fL (ref 80.0–100.0)
Monocytes Absolute: 0.7 10*3/uL (ref 0.1–1.0)
Monocytes Relative: 9 %
Neutro Abs: 4.7 10*3/uL (ref 1.7–7.7)
Neutrophils Relative %: 66 %
Platelets: 239 10*3/uL (ref 150–400)
RBC: 4.13 MIL/uL (ref 3.87–5.11)
RDW: 12.4 % (ref 11.5–15.5)
WBC: 7.1 10*3/uL (ref 4.0–10.5)
nRBC: 0 % (ref 0.0–0.2)

## 2023-07-13 LAB — BASIC METABOLIC PANEL
Anion gap: 9 (ref 5–15)
BUN: 12 mg/dL (ref 8–23)
CO2: 27 mmol/L (ref 22–32)
Calcium: 9.9 mg/dL (ref 8.9–10.3)
Chloride: 97 mmol/L — ABNORMAL LOW (ref 98–111)
Creatinine, Ser: 0.72 mg/dL (ref 0.44–1.00)
GFR, Estimated: >60 mL/min (ref >60)
Glucose, Bld: 94 mg/dL (ref 70–99)
Potassium: 3.9 mmol/L (ref 3.5–5.1)
Sodium: 133 mmol/L — ABNORMAL LOW (ref 135–145)

## 2023-07-21 DIAGNOSIS — I10 Essential (primary) hypertension: Secondary | ICD-10-CM | POA: Diagnosis not present

## 2023-07-21 DIAGNOSIS — Z1389 Encounter for screening for other disorder: Secondary | ICD-10-CM | POA: Diagnosis not present

## 2023-07-21 DIAGNOSIS — R928 Other abnormal and inconclusive findings on diagnostic imaging of breast: Secondary | ICD-10-CM | POA: Diagnosis not present

## 2023-07-21 DIAGNOSIS — I480 Paroxysmal atrial fibrillation: Secondary | ICD-10-CM | POA: Diagnosis not present

## 2023-07-21 DIAGNOSIS — Z0131 Encounter for examination of blood pressure with abnormal findings: Secondary | ICD-10-CM | POA: Diagnosis not present

## 2023-07-22 ENCOUNTER — Telehealth: Payer: Self-pay | Admitting: Cardiology

## 2023-07-22 NOTE — Telephone Encounter (Signed)
Just FYI.  Patient scheduled for 11/05.  Thanks!

## 2023-07-22 NOTE — Telephone Encounter (Signed)
Chaurise with [employer] is calling to report the CT is authorized as of 07/17/23 and is valid for 6 months.

## 2023-07-27 ENCOUNTER — Telehealth (HOSPITAL_COMMUNITY): Payer: Self-pay | Admitting: *Deleted

## 2023-07-27 ENCOUNTER — Telehealth (HOSPITAL_COMMUNITY): Payer: Self-pay | Admitting: Emergency Medicine

## 2023-07-27 NOTE — Telephone Encounter (Signed)
Attempted to call patient regarding upcoming cardiac CT appointment. °Left message on voicemail with name and callback number °Dwan Fennel RN Navigator Cardiac Imaging °[facility] °[phone] Office °[phone] Cell ° °

## 2023-07-27 NOTE — Telephone Encounter (Signed)
Reaching out to patient to offer assistance regarding upcoming cardiac imaging study; pt verbalizes understanding of appt date/time, parking situation and where to check in, pre-test NPO status  and verified current allergies; name and call back number provided for further questions should they arise  Bill Mcvey RN Navigator Cardiac Imaging  [facility] [phone] office [phone] cell  Patient to take her daily medications. 

## 2023-07-28 ENCOUNTER — Ambulatory Visit
Admission: RE | Admit: 2023-07-28 | Discharge: 2023-07-28 | Disposition: A | Discharge status: Home / Self Care | Payer: Medicare HMO | Source: Ambulatory Visit | Attending: Cardiology | Admitting: Cardiology

## 2023-07-28 DIAGNOSIS — I480 Paroxysmal atrial fibrillation: Secondary | ICD-10-CM | POA: Diagnosis not present

## 2023-07-28 DIAGNOSIS — I10 Essential (primary) hypertension: Secondary | ICD-10-CM | POA: Diagnosis not present

## 2023-07-28 MED ORDER — IOHEXOL 350 MG/ML SOLN
75.0000 mL | Freq: Once | INTRAVENOUS | Status: AC | PRN
  Administered 2023-07-28: 75 mL via INTRAVENOUS

## 2023-07-28 MED ORDER — SODIUM CHLORIDE 0.9 % IV SOLN: INTRAVENOUS | Status: DC

## 2023-08-05 NOTE — Pre-Procedure Instructions (Signed)
Instructed patient on the following items: Arrival time 0800 Nothing to eat or drink after midnight No meds AM of procedure Responsible person to drive you home and stay with you for 24 hrs  Have you missed any doses of anti-coagulant Eliquis-takes twice a day, hasn't missed any doses.  Don't take dose in the morning.

## 2023-08-06 ENCOUNTER — Ambulatory Visit (HOSPITAL_BASED_OUTPATIENT_CLINIC_OR_DEPARTMENT_OTHER): Payer: Medicare HMO | Admitting: Anesthesiology

## 2023-08-06 ENCOUNTER — Ambulatory Visit (HOSPITAL_COMMUNITY)
Admission: RE | Disposition: A | Discharge status: Home / Self Care | Payer: Self-pay | Source: Home / Self Care | Attending: Cardiology

## 2023-08-06 ENCOUNTER — Other Ambulatory Visit (HOSPITAL_COMMUNITY): Payer: Self-pay

## 2023-08-06 ENCOUNTER — Ambulatory Visit (HOSPITAL_COMMUNITY): Payer: Medicare HMO | Admitting: Anesthesiology

## 2023-08-06 ENCOUNTER — Ambulatory Visit (HOSPITAL_COMMUNITY)
Admission: RE | Admit: 2023-08-06 | Discharge: 2023-08-06 | Disposition: A | Discharge status: Home / Self Care | Payer: Medicare HMO | Attending: Cardiology | Admitting: Cardiology

## 2023-08-06 DIAGNOSIS — I10 Essential (primary) hypertension: Secondary | ICD-10-CM | POA: Diagnosis not present

## 2023-08-06 DIAGNOSIS — Z87891 Personal history of nicotine dependence: Secondary | ICD-10-CM | POA: Diagnosis not present

## 2023-08-06 DIAGNOSIS — I4819 Other persistent atrial fibrillation: Secondary | ICD-10-CM | POA: Diagnosis not present

## 2023-08-06 DIAGNOSIS — I4891 Unspecified atrial fibrillation: Secondary | ICD-10-CM

## 2023-08-06 HISTORY — PX: ATRIAL FIBRILLATION ABLATION: EP1191

## 2023-08-06 LAB — POCT ACTIVATED CLOTTING TIME: Activated Clotting Time: 291 s

## 2023-08-06 SURGERY — ATRIAL FIBRILLATION ABLATION
Anesthesia: General

## 2023-08-06 MED ORDER — HEPARIN SODIUM (PORCINE) 1000 UNIT/ML IJ SOLN
INTRAMUSCULAR | Status: DC | PRN
  Administered 2023-08-06: 10000 [IU] via INTRAVENOUS
  Administered 2023-08-06: 3000 [IU] via INTRAVENOUS

## 2023-08-06 MED ORDER — PANTOPRAZOLE SODIUM 40 MG PO TBEC
40.0000 mg | DELAYED_RELEASE_TABLET | Freq: Every day | ORAL | Status: DC
  Administered 2023-08-06: 40 mg via ORAL
  Filled 2023-08-06: qty 1

## 2023-08-06 MED ORDER — FENTANYL CITRATE (PF) 250 MCG/5ML IJ SOLN
INTRAMUSCULAR | Status: DC | PRN
  Administered 2023-08-06 (×2): 50 ug via INTRAVENOUS

## 2023-08-06 MED ORDER — COLCHICINE 0.6 MG PO TABS
0.6000 mg | ORAL_TABLET | Freq: Two times a day (BID) | ORAL | Status: DC
  Administered 2023-08-06: 0.6 mg via ORAL
  Filled 2023-08-06: qty 1

## 2023-08-06 MED ORDER — DEXAMETHASONE SODIUM PHOSPHATE 10 MG/ML IJ SOLN
INTRAMUSCULAR | Status: DC | PRN
  Administered 2023-08-06: 8 mg via INTRAVENOUS

## 2023-08-06 MED ORDER — SODIUM CHLORIDE 0.9 % IV SOLN: INTRAVENOUS | Status: DC

## 2023-08-06 MED ORDER — PHENYLEPHRINE 80 MCG/ML (10ML) SYRINGE FOR IV PUSH (FOR BLOOD PRESSURE SUPPORT)
PREFILLED_SYRINGE | INTRAVENOUS | Status: DC | PRN
  Administered 2023-08-06 (×3): 80 ug via INTRAVENOUS

## 2023-08-06 MED ORDER — SODIUM CHLORIDE 0.9% FLUSH: 3.0000 mL | Freq: Two times a day (BID) | INTRAVENOUS | Status: DC

## 2023-08-06 MED ORDER — HEPARIN SODIUM (PORCINE) 1000 UNIT/ML IJ SOLN
INTRAMUSCULAR | Status: AC
  Filled 2023-08-06: qty 10

## 2023-08-06 MED ORDER — ONDANSETRON HCL 4 MG/2ML IJ SOLN
INTRAMUSCULAR | Status: DC | PRN
  Administered 2023-08-06: 4 mg via INTRAVENOUS

## 2023-08-06 MED ORDER — HEPARIN (PORCINE) IN NACL 1000-0.9 UT/500ML-% IV SOLN
INTRAVENOUS | Status: DC | PRN
  Administered 2023-08-06 (×3): 500 mL

## 2023-08-06 MED ORDER — ATROPINE SULFATE 1 MG/ML IV SOLN
INTRAVENOUS | Status: DC | PRN
  Administered 2023-08-06: .5 mg via INTRAVENOUS

## 2023-08-06 MED ORDER — MIDAZOLAM HCL 2 MG/2ML IJ SOLN
INTRAMUSCULAR | Status: DC | PRN
  Administered 2023-08-06: 1 mg via INTRAVENOUS

## 2023-08-06 MED ORDER — ONDANSETRON HCL 4 MG/2ML IJ SOLN: 4.0000 mg | Freq: Four times a day (QID) | INTRAMUSCULAR | Status: DC | PRN

## 2023-08-06 MED ORDER — PROPOFOL 10 MG/ML IV BOLUS
INTRAVENOUS | Status: DC | PRN
  Administered 2023-08-06: 100 mg via INTRAVENOUS

## 2023-08-06 MED ORDER — FENTANYL CITRATE (PF) 100 MCG/2ML IJ SOLN
INTRAMUSCULAR | Status: AC
  Filled 2023-08-06: qty 2

## 2023-08-06 MED ORDER — SODIUM CHLORIDE 0.9% FLUSH: 3.0000 mL | INTRAVENOUS | Status: DC | PRN

## 2023-08-06 MED ORDER — LIDOCAINE 2% (20 MG/ML) 5 ML SYRINGE
INTRAMUSCULAR | Status: DC | PRN
  Administered 2023-08-06: 60 mg via INTRAVENOUS

## 2023-08-06 MED ORDER — ACETAMINOPHEN 325 MG PO TABS: 650.0000 mg | ORAL_TABLET | ORAL | Status: DC | PRN

## 2023-08-06 MED ORDER — ACETAMINOPHEN 500 MG PO TABS
1000.0000 mg | ORAL_TABLET | Freq: Once | ORAL | Status: AC
  Administered 2023-08-06: 1000 mg via ORAL
  Filled 2023-08-06: qty 2

## 2023-08-06 MED ORDER — PROTAMINE SULFATE 10 MG/ML IV SOLN
INTRAVENOUS | Status: DC | PRN
  Administered 2023-08-06: 35 mg via INTRAVENOUS

## 2023-08-06 MED ORDER — SUGAMMADEX SODIUM 200 MG/2ML IV SOLN
INTRAVENOUS | Status: DC | PRN
  Administered 2023-08-06: 200 mg via INTRAVENOUS

## 2023-08-06 MED ORDER — ROCURONIUM BROMIDE 10 MG/ML (PF) SYRINGE
PREFILLED_SYRINGE | INTRAVENOUS | Status: DC | PRN
  Administered 2023-08-06: 40 mg via INTRAVENOUS

## 2023-08-06 MED ORDER — COLCHICINE 0.6 MG PO TABS
0.6000 mg | ORAL_TABLET | Freq: Two times a day (BID) | ORAL | 0 refills | Status: DC
  Filled 2023-08-06: qty 10, 5d supply, fill #0

## 2023-08-06 MED ORDER — PANTOPRAZOLE SODIUM 40 MG PO TBEC
40.0000 mg | DELAYED_RELEASE_TABLET | Freq: Every day | ORAL | 0 refills | Status: AC
  Filled 2023-08-06: qty 45, 45d supply, fill #0

## 2023-08-06 MED ORDER — MIDAZOLAM HCL 5 MG/5ML IJ SOLN
INTRAMUSCULAR | Status: AC
  Filled 2023-08-06: qty 5

## 2023-08-06 MED ORDER — APIXABAN 5 MG PO TABS
5.0000 mg | ORAL_TABLET | Freq: Two times a day (BID) | ORAL | Status: DC
Start: 2023-08-06 — End: 2023-08-06
  Administered 2023-08-06: 5 mg via ORAL
  Filled 2023-08-06: qty 1

## 2023-08-06 MED ORDER — SODIUM CHLORIDE 0.9 % IV SOLN: 250.0000 mL | INTRAVENOUS | Status: DC | PRN

## 2023-08-06 MED ORDER — LACTATED RINGERS IV SOLN: INTRAVENOUS | Status: DC | PRN

## 2023-08-06 SURGICAL SUPPLY — 17 items
CABLE PFA RX CATH CONN (CABLE) ×1
CATH 8FR REPROCESSED SOUNDSTAR (CATHETERS) ×1 IMPLANT
CATH FARAWAVE ABLATION 31 (CATHETERS) ×1
CATH OCTARAY 2.0 F 3-3-3-3-3 (CATHETERS) ×1
CATH WEB BI DIR CSDF CRV REPRO (CATHETERS) ×1
CLOSURE PERCLOSE PROSTYLE (VASCULAR PRODUCTS) ×4
COVER SWIFTLINK CONNECTOR (BAG) ×1
DILATOR VESSEL 38 20CM 16FR (INTRODUCER) ×1
INQWIRE 1.5J .035X260CM (WIRE) ×1
PACK EP LF (CUSTOM PROCEDURE TRAY) ×1
PAD DEFIB RADIO PHYSIO CONN (PAD) ×1
PATCH CARTO3 (PAD) ×1
SHEATH FARADRIVE STEERABLE (SHEATH) ×1
SHEATH PINNACLE 8F 10CM (SHEATH) ×2
SHEATH PINNACLE 9F 10CM (SHEATH) ×1
SHEATH PROBE COVER 6X72 (BAG) ×1
SHEATH WIRE KIT BAYLIS SL1 (KITS) ×1

## 2023-08-06 NOTE — H&P (Signed)
Electrophysiology Office Note:     Date:  08/06/2023    ID:  Janet Scott, DOB [birth year], MRN [id number]   [facility] Cardiologist:  Debbe Odea, MD  [facility] Electrophysiologist:  Lanier Prude, MD    Referring MD: Debbe Odea, MD    Chief Complaint: Atrial fibrillation   History of Present Illness:     Janet Scott is a 77 y.o. femalewho I am seeing today for an evaluation of atrial fibrillation at the request of Dr. Azucena Cecil.   The patient was last seen by Dr. Azucena Cecil on January 14, 2023.   The patient has a medical history that includes arthritis and hypertension.  The patient was diagnosed with atrial fibrillation in February of this year when she presented for a breast biopsy.  At that time she was in rate controlled atrial fibrillation.  She reported fatigue over the last few months that she attributed to possible atrial fibrillation.  Rhythm control was recommended and discussions were planned for the outpatient setting by Dr. Gasper Lloyd.   Presents for PVI. Procedure reviewed.    Objective Their past medical, social and family history was reveiwed.     ROS:   Please see the history of present illness.    All other systems reviewed and are negative.   EKGs/Labs/Other Studies Reviewed:     The following studies were reviewed today:   January 12, 2023 echo EF 60-65 RV normal Moderately dilated left atrium Trivial MR   November 28, 2022 EKG shows sinus rhythm November 13, 2022 EKG shows sinus rhythm November 19, 2022 EKG shows atrial fibrillation with a ventricular rate of 104 bpm and a low voltage QRS   EKG Interpretation Date/Time:                  Thursday March 19 2023 12:08:51 EDT Ventricular Rate:         117 PR Interval:                   QRS Duration:             56 QT Interval:                 320 QTC Calculation:446 R Axis:                         56   Text Interpretation:Atrial fibrillation with rapid ventricular  response Confirmed by Steffanie Dunn [phone]) on 03/19/2023 10:21:18 PM      Physical Exam:     VS:  BP 145/69   Pulse 68   Ht 5\' 1"  (1.549 m)   Wt 147 lb 9.6 oz (67 kg)   SpO2 99%   BMI 27.89 kg/m         Wt Readings from Last 3 Encounters:  03/19/23 147 lb 9.6 oz (67 kg)  01/14/23 150 lb (68 kg)  11/28/22 149 lb 9.6 oz (67.9 kg)      GEN:  Well nourished, well developed in no acute distress CARDIAC: RRR, no murmurs, rubs, gallops RESPIRATORY:  Clear to auscultation without rales, wheezing or rhonchi          Assessment ASSESSMENT AND PLAN:     1. Paroxysmal atrial fibrillation (HCC)   2. Primary hypertension       #Paroxysmal atrial fibrillation Symptomatic, associated with fatigue.  On Eliquis for stroke prophylaxis, continue.  Discussed treatment options for her atrial fibrillation including antiarrhythmic drug therapy,  conservative/continued watchful waiting and catheter ablation.   She would like to avoid medications if at all possible which is very reasonable. She would like to pursue rhythm control. I will increase her verapamil today to improve her rate control.   Discussed treatment options today for AF including antiarrhythmic drug therapy and ablation. Discussed risks, recovery and likelihood of success with each treatment strategy. Risk, benefits, and alternatives to EP study and radiofrequency ablation for afib were discussed. These risks include but are not limited to stroke, bleeding, vascular damage, tamponade, perforation, damage to the esophagus, lungs, phrenic nerve and other structures, pulmonary vein stenosis, worsening renal function, and death.  Discussed potential need for repeat ablation procedures and antiarrhythmic drugs after an initial ablation. The patient understands these risk and wishes to proceed.  We will therefore proceed with catheter ablation at the next available time.  Carto, ICE, anesthesia are requested for the procedure.  Will also  obtain CT PV protocol prior to the procedure to exclude LAA thrombus and further evaluate atrial anatomy.     #Hypertension Above goal today.  Recommend checking blood pressures 1-2 times per week at home and recording the values.  Recommend bringing these recordings to the primary care physician.      Presents for PVI. Procedure reviewed.       Signed, Rossie Muskrat. Lalla Brothers, MD, [facility], [facility] 08/06/2023 Electrophysiology [facility]

## 2023-08-06 NOTE — Anesthesia Procedure Notes (Addendum)
Procedure Name: Intubation Date/Time: 08/06/2023 10:11 AM  Performed by: Hilda Lias, CRNAPre-anesthesia Checklist: Patient identified, Emergency Drugs available, Suction available and Patient being monitored Patient Re-evaluated:Patient Re-evaluated prior to induction Oxygen Delivery Method: Circle System Utilized Preoxygenation: Pre-oxygenation with 100% oxygen Induction Type: IV induction Ventilation: Mask ventilation without difficulty Laryngoscope Size: Mac and 3 Grade View: Grade I Tube type: Oral Tube size: 7.0 mm Number of attempts: 1 Airway Equipment and Method: Stylet Placement Confirmation: ETT inserted through vocal cords under direct vision, positive ETCO2, breath sounds checked- equal and bilateral and CO2 detector Secured at: 21 cm Tube secured with: Tape Dental Injury: Teeth and Oropharynx as per pre-operative assessment

## 2023-08-06 NOTE — Transfer of Care (Signed)
Immediate Anesthesia Transfer of Care Note  Patient: Janet Scott  Procedure(s) Performed: ATRIAL FIBRILLATION ABLATION  Patient Location: PACU  Anesthesia Type:General  Level of Consciousness: awake, alert , and oriented  Airway & Oxygen Therapy: Patient Spontanous Breathing and Patient connected to nasal cannula oxygen  Post-op Assessment: Report given to RN and Post -op Vital signs reviewed and stable  Post vital signs: Reviewed and stable  Last Vitals:  Vitals Value Taken Time  BP 157/60 08/06/23 1200  Temp 36.6 C 08/06/23 1200  Pulse 75 08/06/23 1203  Resp 16 08/06/23 1203  SpO2 95 % 08/06/23 1203  Vitals shown include unfiled device data.  Last Pain:  Vitals:   08/06/23 1200  TempSrc: Temporal  PainSc: 0-No pain         Complications: There were no known notable events for this encounter.

## 2023-08-06 NOTE — Anesthesia Postprocedure Evaluation (Signed)
Anesthesia Post Note  Patient: Janet Scott  Procedure(s) Performed: ATRIAL FIBRILLATION ABLATION     Patient location during evaluation: Phase II Anesthesia Type: General Level of consciousness: awake and alert, patient cooperative and oriented Pain management: pain level controlled Vital Signs Assessment: post-procedure vital signs reviewed and stable Respiratory status: nonlabored ventilation, spontaneous breathing and respiratory function stable Cardiovascular status: blood pressure returned to baseline and stable Postop Assessment: no apparent nausea or vomiting and adequate PO intake Anesthetic complications: no   There were no known notable events for this encounter.  Last Vitals:  Vitals:   08/06/23 1240 08/06/23 1245  BP: (!) 141/69 (!) 140/86  Pulse: 70 70  Resp: 13 13  Temp:    SpO2: 92% 92%    Last Pain:  Vitals:   08/06/23 1231  TempSrc: Temporal  PainSc: 0-No pain                 Shadi Sessler,E. Airica Schwartzkopf

## 2023-08-06 NOTE — Discharge Instructions (Signed)

## 2023-08-06 NOTE — Anesthesia Preprocedure Evaluation (Signed)
Anesthesia Evaluation  Patient identified by MRN, date of birth, ID band Patient awake    Reviewed: Allergy & Precautions, NPO status , Patient's Chart, lab work & pertinent test results  History of Anesthesia Complications Negative for: history of anesthetic complications  Airway Mallampati: II  TM Distance: >3 FB Neck ROM: Full    Dental  (+) Missing, Dental Advisory Given   Pulmonary former smoker   breath sounds clear to auscultation       Cardiovascular hypertension, Pt. on medications + dysrhythmias Atrial Fibrillation  Rhythm:Irregular Rate:Normal  12/2022 ECHO: EF 60 to 65%.  1. The LV has normal function, no regional wall motion abnormalities. There is mild LVH of the basal-septal segment. Grade I diastolic dysfunction (impaired relaxation).   2. RVF is normal. The right ventricular size is normal.   3. Left atrial size was moderately dilated.   4. The mitral valve is normal in structure. Trivial mitral valve regurgitation. No evidence of mitral stenosis.   5. The aortic valve is tricuspid. There is mild calcification of the aortic valve. Aortic valve regurgitation is mild. Aortic valve sclerosis/calcification is present, without any evidence of aortic stenosis. Aortic valve area, by VTI measures 2.46 cm.   Aortic valve mean gradient measures 4.0 mmHg. Aortic valve Vmax measures  1.32 m/s.     Neuro/Psych negative neurological ROS     GI/Hepatic negative GI ROS, Neg liver ROS,,,  Endo/Other  negative endocrine ROS    Renal/GU negative Renal ROS     Musculoskeletal   Abdominal   Peds  Hematology eliquis   Anesthesia Other Findings   Reproductive/Obstetrics                             Anesthesia Physical Anesthesia Plan  ASA: 3  Anesthesia Plan: General   Post-op Pain Management: Tylenol PO (pre-op)*   Induction: Intravenous  PONV Risk Score and Plan: 3 and Ondansetron,  Dexamethasone and Treatment may vary due to age or medical condition  Airway Management Planned: Oral ETT  Additional Equipment: None  Intra-op Plan:   Post-operative Plan: Extubation in OR  Informed Consent: I have reviewed the patients History and Physical, chart, labs and discussed the procedure including the risks, benefits and alternatives for the proposed anesthesia with the patient or authorized representative who has indicated his/her understanding and acceptance.     Dental advisory given  Plan Discussed with: CRNA and Surgeon  Anesthesia Plan Comments:        Anesthesia Quick Evaluation

## 2023-08-07 ENCOUNTER — Encounter (HOSPITAL_COMMUNITY): Payer: Self-pay | Admitting: Cardiology

## 2023-08-07 MED FILL — Midazolam HCl Inj 5 MG/5ML (Base Equivalent): INTRAMUSCULAR | Qty: 1 | Status: AC

## 2023-08-07 MED FILL — Fentanyl Citrate Preservative Free (PF) Inj 100 MCG/2ML: INTRAMUSCULAR | Qty: 2 | Status: AC

## 2023-08-21 ENCOUNTER — Other Ambulatory Visit (HOSPITAL_COMMUNITY): Payer: Self-pay

## 2023-09-02 ENCOUNTER — Encounter: Payer: Self-pay | Admitting: *Deleted

## 2023-09-09 NOTE — Progress Notes (Unsigned)
Cardiology Clinic Note    Date:  09/10/2023  Patient ID:  [name], [name] September 06, 1946, MRN [id number] PCP:  [facility]  Cardiologist:  Debbe Odea, MD Electrophysiologist: Lanier Prude, MD    Patient Profile    Chief Complaint: AF ablation follow-up  History of Present Illness: Janet Scott is a 77 y.o. female with PMH notable for parox AFib, HTN; seen today for Lanier Prude, MD for routine electrophysiology followup after AFib ablation.  She is s/p AFib ablation w PVI and posterior wall on 08/06/2023.   On follow-up today, she is not aware of any afib episodes. Prior to ablation she was not aware of her arrhythmia either, but did have fatigue. Her energy has improved since ablation. She does continue to get tired with walking long distances, but is able to grocery shop and bring groceries into the house without symptoms or needing to rest, which is improved since ablation. She diligently takes BP at home, most readings 130s/60-70s. She continues to take eliquis BID, no bleeding concerns. She denies SOB, palpitations, chest pain or pressure, dizziness or syncope.   Her PCP questions when she can have breast biopsy.   AAD History: None     ROS:  Please see the history of present illness. All other systems are reviewed and otherwise negative.    Physical Exam    VS:  BP (!) 156/84   Pulse 75   Ht 5\' 1"  (1.549 m)   Wt 148 lb 9.6 oz (67.4 kg)   SpO2 98%   BMI 28.08 kg/m  BMI: Body mass index is 28.08 kg/m.  Wt Readings from Last 3 Encounters:  09/10/23 148 lb 9.6 oz (67.4 kg)  08/06/23 148 lb (67.1 kg)  03/19/23 147 lb 9.6 oz (67 kg)     GEN- The patient is well appearing, alert and oriented x 3 today.   Lungs- Clear to ausculation bilaterally, normal work of breathing.  Heart- Regular rate and rhythm, no murmurs, rubs or gallops Extremities- No peripheral edema, warm, dry    Studies Reviewed   Previous EP,  cardiology notes.    EKG is ordered. Personal review of EKG from today shows:    EKG Interpretation Date/Time:  Thursday September 10 2023 11:30:56 EST Ventricular Rate:  75 PR Interval:  148 QRS Duration:  72 QT Interval:  384 QTC Calculation: 428 R Axis:   10  Text Interpretation: Normal sinus rhythm Normal ECG Confirmed by Sherie Don [phone]) on 09/10/2023 11:38:12 AM    TTE, 01/12/2023  1. Left ventricular ejection fraction, by estimation, is 60 to 65%. The left ventricle has normal function. The left ventricle has no regional wall motion abnormalities. There is mild left ventricular hypertrophy of the basal-septal segment. Left ventricular diastolic parameters are consistent with Grade I diastolic dysfunction (impaired relaxation).   2. Right ventricular systolic function is normal. The right ventricular size is normal.   3. Left atrial size was moderately dilated.   4. The mitral valve is normal in structure. Trivial mitral valve regurgitation. No evidence of mitral stenosis.   5. The aortic valve is tricuspid. There is mild calcification of the aortic valve. Aortic valve regurgitation is mild. Aortic valve sclerosis/calcification is present, without any evidence of aortic stenosis. Aortic valve area, by VTI measures 2.46 cm.  Aortic valve mean gradient measures 4.0 mmHg. Aortic valve Vmax measures 1.32 m/s.   6. The inferior vena cava is normal in size with greater  than 50% respiratory variability, suggesting right atrial pressure of 3 mmHg.      Assessment and Plan     #) parox afib S/p recent AFib ablation with improvement in energy levels since procedure Sinus rhythm on today's EKG Continue 120mg  verapamil BID  #) Hypercoag d/t parox afib CHA2DS2-VASc Score = at least 4 [CHF History: 0, HTN History: 1, Diabetes History: 0, Stroke History: 0, Vascular Disease History: 0, Age Score: 2, Gender Score: 1].  Therefore, the patient's annual risk of stroke is 4.8 %.    Stroke  ppx - 5mg  eliquis BID, appropriately dosed No bleeding concerns Do not favor pausing OAC for elective procedure until 3 months post-ablation   #) HTN Elevated in office today, but good BP control at home Recommend checking blood pressures 1-2 times per week at home and recording the values.  Recommend bringing these recordings to the primary care physician.       Current medicines are reviewed at length with the patient today.   The patient does not have concerns regarding her medicines.  The following changes were made today:  none  Labs/ tests ordered today include:  Orders Placed This Encounter  Procedures   EKG 12-Lead     Disposition: Follow up with Dr. Lalla Brothers or EP APP  2 months    Signed, Sherie Don, NP  09/10/23  12:11 PM  Electrophysiology [facility]

## 2023-09-10 ENCOUNTER — Encounter: Payer: Self-pay | Admitting: Cardiology

## 2023-09-10 ENCOUNTER — Ambulatory Visit: Payer: Medicare HMO | Attending: Cardiology | Admitting: Cardiology

## 2023-09-10 ENCOUNTER — Telehealth: Payer: Self-pay | Admitting: Cardiology

## 2023-09-10 VITALS — BP 156/84 | HR 75 | Ht 61.0 in | Wt 148.6 lb

## 2023-09-10 DIAGNOSIS — D6869 Other thrombophilia: Secondary | ICD-10-CM | POA: Diagnosis not present

## 2023-09-10 DIAGNOSIS — I10 Essential (primary) hypertension: Secondary | ICD-10-CM | POA: Diagnosis not present

## 2023-09-10 DIAGNOSIS — I480 Paroxysmal atrial fibrillation: Secondary | ICD-10-CM

## 2023-09-10 NOTE — Telephone Encounter (Signed)
I attempted to call patient's PCP to obtain more information regarding breast biopsy that has been recommended.   LM with scheduling staff member.

## 2023-09-10 NOTE — Patient Instructions (Signed)
Medication Instructions:  Your physician recommends that you continue on your current medications as directed. Please refer to the Current Medication list given to you today.  *If you need a refill on your cardiac medications before your next appointment, please call your pharmacy*   Lab Work:  NONE  If you have labs (blood work) drawn today and your tests are completely normal, you will receive your results only by: MyChart Message (if you have MyChart) OR A paper copy in the mail If you have any lab test that is abnormal or we need to change your treatment, we will call you to review the results.   Testing/Procedures:  NONE   Follow-Up: At [facility], you and your health needs are our priority.  As part of our continuing mission to provide you with exceptional heart care, we have created designated Provider Care Teams.  These Care Teams include your primary Cardiologist (physician) and Advanced Practice Providers (APPs -  Physician Assistants and Nurse Practitioners) who all work together to provide you with the care you need, when you need it.  We recommend signing up for the patient portal called "MyChart".  Sign up information is provided on this After Visit Summary.  MyChart is used to connect with patients for Virtual Visits (Telemedicine).  Patients are able to view lab/test results, encounter notes, upcoming appointments, etc.  Non-urgent messages can be sent to your provider as well.   To learn more about what you can do with MyChart, go to [email or web address].    Your next appointment:     Keep scheduled appointment.  Provider:   Sherie Don, NP

## 2023-09-11 NOTE — Telephone Encounter (Signed)
I again called PCP office, left msg with scheduling staff.

## 2023-09-14 NOTE — Telephone Encounter (Signed)
Dr recommend patient stayed off Eliquis two day prior and start back 1 day after. Please advise

## 2023-09-14 NOTE — Telephone Encounter (Signed)
I again attempted to reach PCP.   I attempted to call patient to request she reach out to PCP to discuss breast biopsy

## 2023-09-15 ENCOUNTER — Telehealth: Payer: Self-pay | Admitting: Cardiology

## 2023-09-15 NOTE — Telephone Encounter (Signed)
The patient has been advised that Luella Cook was trying to reach her and the PCP for the following:  Per Suzann: Could you please call this patient's PCP to relay the below information? I've tried to call several times.  This patient just had an AF ablation 11/14. We do not recommend stopping/pausing OAC for elective procedures until 3 months post-procedure. Is the procedure urgent / emergent?   The patient has been made aware that the PCP office is closed today. We will try to reach that on Thursday.

## 2023-09-15 NOTE — Telephone Encounter (Signed)
Follow Up:     Patient says she is returning a call from yesterday,

## 2023-09-15 NOTE — Telephone Encounter (Signed)
I attempted to contact the PCP office and received a recorded message stating that the office is currently closed due to the holiday.  Janet Don, NP  [facility] [phone] minutes ago (7:56 AM)    Could you please call this patient's PCP to relay the below information? I've tried to call several times.   This patient just had an AF ablation 11/14. We do not recommend stopping/pausing OAC for elective procedures until 3 months post-procedure. Is the procedure urgent / emergent?

## 2023-09-17 NOTE — Telephone Encounter (Signed)
I spoke with Dr. Cyndi Bender office and was made aware of Sherie Don, NP's recommendations. According to Dr. Kreg Shropshire, it is okay to wait until February for the breast biopsy.  Will forward to NP to make aware.

## 2023-11-05 NOTE — Progress Notes (Unsigned)
Cardiology Clinic Note    Date:  11/06/2023  Patient ID:  Janet Scott, Janet Scott, Janet Scott, Janet Scott PCP:  [facility]  Cardiologist:  Debbe Odea, MD Electrophysiologist: Lanier Prude, MD   Discussed the use of AI scribe software for clinical note transcription with the patient, who gave verbal consent to proceed.   Patient Profile    Chief Complaint: AF ablation follow-up  History of Present Illness: Janet Scott is a 78 y.o. female with PMH notable for parox AFib, HTN; seen today for Lanier Prude, MD for routine electrophysiology followup after AFib ablation.  She is s/p AFib ablation w PVI and posterior wall on 08/06/2023.   She was maintaining sinus at her 1mon post-ablation appt.  On follow-up today, she has had no episodes of AFib that she is aware of. Historically, as asymtpomatic of AFib episodes.  She checks BP regularly, brings log to appt. Systolic readings 120-130s, never higher. She continues to take eliquis BID, no bleeding concerns.  She questions whether she needs to continue verapamil. Has noticed she is having strange dreams at night.  She also c/o constipation.   AAD History: None     ROS:  Please see the history of present illness. All other systems are reviewed and otherwise negative.    Physical Exam    VS:  BP (!) 140/72 (BP Location: Left Arm, Patient Position: Sitting, Cuff Size: Normal)   Pulse 80   Ht 5\' 1"  (1.549 m)   Wt 148 lb 2 oz (67.2 kg)   SpO2 98%   BMI 27.99 kg/m  BMI: Body mass index is 27.99 kg/m.  Wt Readings from Last 3 Encounters:  11/06/23 148 lb 2 oz (67.2 kg)  12/Scott/24 148 lb 9.6 oz (67.4 kg)  08/06/23 148 lb (67.1 kg)     GEN- The patient is well appearing, alert and oriented x 3 today.   Lungs- Clear to ausculation bilaterally, normal work of breathing.  Heart- Regular rate and rhythm, no murmurs, rubs or gallops Extremities- No peripheral edema, warm, dry    Studies  Reviewed   Previous EP, cardiology notes.    EKG is ordered. Personal review of EKG from today shows:    EKG Interpretation Date/Time:  Friday November 06 2023 11:12:02 EST Ventricular Rate:  80 PR Interval:  152 QRS Duration:  54 QT Interval:  340 QTC Calculation: 392 R Axis:   -26  Text Interpretation: Normal sinus rhythm Low voltage QRS Confirmed by Sherie Don [id number] on 11/06/2023 11:14:59 AM    TTE, 01/12/2023  1. Left ventricular ejection fraction, by estimation, is 60 to 65%. The left ventricle has normal function. The left ventricle has no regional wall motion abnormalities. There is mild left ventricular hypertrophy of the basal-septal segment. Left ventricular diastolic parameters are consistent with Grade I diastolic dysfunction (impaired relaxation).   2. Right ventricular systolic function is normal. The right ventricular size is normal.   3. Left atrial size was moderately dilated.   4. The mitral valve is normal in structure. Trivial mitral valve regurgitation. No evidence of mitral stenosis.   5. The aortic valve is tricuspid. There is mild calcification of the aortic valve. Aortic valve regurgitation is mild. Aortic valve sclerosis/calcification is present, without any evidence of aortic stenosis. Aortic valve area, by VTI measures 2.46 cm.  Aortic valve mean gradient measures 4.0 mmHg. Aortic valve Vmax measures 1.32 m/s.   6. The inferior vena cava is normal  in size with greater than 50% respiratory variability, suggesting right atrial pressure of 3 mmHg.      Assessment and Plan     #) parox afib S/p recent AFib ablation  Maintaining sinus rhythm since procedure Continue 120mg  verapamil BID Do not favor transitioning to alternative CCB at this time. Can consider if dreams worsen  #) Hypercoag d/t parox afib CHA2DS2-VASc Score = at least 4 [CHF History: 0, HTN History: 1, Diabetes History: 0, Stroke History: 0, Vascular Disease History: 0, Age Score: 2,  Gender Score: 1].  Therefore, the patient's annual risk of stroke is 4.8 %.    Stroke ppx - 5mg  eliquis BID, appropriately dosed No bleeding concerns   #) HTN Elevated in office today, but good BP control at home Recommend checking blood pressures 1-2 times per week at home and recording the values.  Recommend bringing these recordings to the primary care physician.  #) constipation Patient reports constipation, currently using MiraLAX PRN  -Increase MiraLAX as needed. -Discuss constipation with primary care provider during upcoming visit.  #) Breast Biopsy Suspicious cells found on recent mammogram, biopsy planned. -Ok to pause eliquis for biopsy procedure, if needed. Resume once appropriate after procedure.       Current medicines are reviewed at length with the patient today.   The patient does not have concerns regarding her medicines.  The following changes were made today:  none  Labs/ tests ordered today include:  Orders Placed This Encounter  Procedures   EKG 12-Lead     Disposition: Follow up with Dr. Lalla Brothers or EP APP in 12 months, sooner if needed   Signed, Sherie Don, NP  11/06/23  12:49 PM  Electrophysiology [facility]

## 2023-11-06 ENCOUNTER — Ambulatory Visit: Payer: Medicare HMO | Attending: Cardiology | Admitting: Cardiology

## 2023-11-06 ENCOUNTER — Encounter: Payer: Self-pay | Admitting: Cardiology

## 2023-11-06 VITALS — BP 140/72 | HR 80 | Ht 61.0 in | Wt 148.1 lb

## 2023-11-06 DIAGNOSIS — D6869 Other thrombophilia: Secondary | ICD-10-CM | POA: Diagnosis not present

## 2023-11-06 DIAGNOSIS — I480 Paroxysmal atrial fibrillation: Secondary | ICD-10-CM | POA: Diagnosis not present

## 2023-11-06 DIAGNOSIS — I10 Essential (primary) hypertension: Secondary | ICD-10-CM | POA: Diagnosis not present

## 2023-11-06 NOTE — Patient Instructions (Signed)
Medication Instructions:  The current medical regimen is effective;  continue present plan and medications.  *If you need a refill on your cardiac medications before your next appointment, please call your pharmacy*   Follow-Up: At [facility], you and your health needs are our priority.  As part of our continuing mission to provide you with exceptional heart care, we have created designated Provider Care Teams.  These Care Teams include your primary Cardiologist (physician) and Advanced Practice Providers (APPs -  Physician Assistants and Nurse Practitioners) who all work together to provide you with the care you need, when you need it.  We recommend signing up for the patient portal called "MyChart".  Sign up information is provided on this After Visit Summary.  MyChart is used to connect with patients for Virtual Visits (Telemedicine).  Patients are able to view lab/test results, encounter notes, upcoming appointments, etc.  Non-urgent messages can be sent to your provider as well.   To learn more about what you can do with MyChart, go to [email or web address].    Your next appointment:   12 month(s)  Provider:   Sherie Don, NP

## 2023-11-17 DIAGNOSIS — E669 Obesity, unspecified: Secondary | ICD-10-CM | POA: Diagnosis not present

## 2023-11-17 DIAGNOSIS — I480 Paroxysmal atrial fibrillation: Secondary | ICD-10-CM | POA: Diagnosis not present

## 2023-11-17 DIAGNOSIS — I10 Essential (primary) hypertension: Secondary | ICD-10-CM | POA: Diagnosis not present

## 2023-11-26 ENCOUNTER — Encounter: Payer: Self-pay | Admitting: Surgery

## 2023-11-26 ENCOUNTER — Ambulatory Visit (INDEPENDENT_AMBULATORY_CARE_PROVIDER_SITE_OTHER): Admitting: Surgery

## 2023-11-26 VITALS — BP 135/78 | HR 78 | Temp 98.2°F | Ht 61.0 in | Wt 148.0 lb

## 2023-11-26 DIAGNOSIS — R1084 Generalized abdominal pain: Secondary | ICD-10-CM

## 2023-11-26 DIAGNOSIS — R1011 Right upper quadrant pain: Secondary | ICD-10-CM

## 2023-11-26 DIAGNOSIS — N6081 Other benign mammary dysplasias of right breast: Secondary | ICD-10-CM

## 2023-11-26 NOTE — Patient Instructions (Addendum)
 We will get you scheduled for Mammograms and an Ultrasound of your gallbladder. We will call you with your results.   You are scheduled for an Ultrasound on Tuesday March 11th at [facility], located across the street from our office. You will need to arrive there by 8:30 am and have nothing to eat or drink after midnight the night prior.   You are scheduled for a Mammogram at the [facility] on Monday March 31 st at 2:30 pm. We will schedule your surgery after we get your mammogram results.   We have spoken today about removing a lump in your breast. This will be done by Dr. Claudine Mouton at [facility]: [address]  You will most likely be able to leave the hospital several hours after your surgery. Rarely, a patient needs to stay over night but this is a possibility.  Plan to tentatively be off work for 1-2 weeks following the surgery and may return with approximately 2 more weeks of a lifting restriction, no greater than 15 lbs.  Please see your Blue surgery sheet for more information. Our surgery scheduler will call you to look at surgery dates and to go over information.   If you have FMLA or Disability paperwork that needs to be filled out, please have your company fax your paperwork to [phone] or you may drop this by either office. This paperwork will be filled out within 3 days after your surgery has been completed.    Lumpectomy A lumpectomy is a form of "breast conserving" or "breast preservation" surgery. It may also be referred to as a partial mastectomy. During a lumpectomy, the portion of the breast that contains the cancerous tumor or breast mass (the lump) is removed. Some normal tissue around the lump may also be removed to make sure all of the tumor has been removed.  LET [facility] CARE PROVIDER KNOW ABOUT: Any allergies you have. All medicines you are taking, including vitamins, herbs, eye drops, creams, and over-the-counter medicines. Previous problems  you or members of your family have had with the use of anesthetics. Any blood disorders you have. Previous surgeries you have had. Medical conditions you have. RISKS AND COMPLICATIONS Generally, this is a safe procedure. However, problems can occur and include: Bleeding. Infection. Pain. Temporary swelling. Change in the shape of the breast, particularly if a large portion is removed. BEFORE THE PROCEDURE Ask your health care provider about changing or stopping your regular medicines. This is especially important if you are taking diabetes medicines or blood thinners. Do not eat or drink anything after midnight on the night before the procedure or as directed by your health care provider. Ask your health care provider if you can take a sip of water with any approved medicines. On the day of surgery, your health care provider will use a mammogram or ultrasound to locate and mark the tumor in your breast. These markings on your breast will show where the cut (incision) will be made.   PROCEDURE  An IV tube will be put into one of your veins. You may be given medicine to help you relax before the surgery (sedative). You will be given one of the following: A medicine that numbs the area (local anesthetic). A medicine that makes you fall asleep (general anesthetic). Your health care provider will use a kind of electric scalpel that uses heat to minimize bleeding (electrocautery knife). A curved incision (like a smile or frown) that follows the natural curve of your breast  is made, to allow for minimal scarring and better healing. The tumor will be removed with some of the surrounding tissue. This will be sent to the lab for analysis. Your health care provider may also remove your lymph nodes at this time if needed. Sometimes, but not always, a rubber tube called a drain will be surgically inserted into your breast area or armpit to collect excess fluid that may accumulate in the space where the  tumor was. This drain is connected to a plastic bulb on the outside of your body. This drain creates suction to help remove the fluid. The incisions will be closed with stitches (sutures). A bandage may be placed over the incisions. AFTER THE PROCEDURE You will be taken to the recovery area. You will be given medicine for pain. A small rubber drain may be placed in the breast for 2-3 days to prevent a collection of blood (hematoma) from developing in the breast. You will be given instructions on caring for the drain before you go home. A pressure bandage (dressing) will be applied for 1-2 days to prevent bleeding. Ask your health care provider how to care for your bandage at home.   This information is not intended to replace advice given to you by your health care provider. Make sure you discuss any questions you have with your health care provider.   Document Released: 10/20/2006 Document Revised: 09/29/2014 Document Reviewed: 02/11/2013 Elsevier Interactive Patient Education [employer]

## 2023-11-26 NOTE — Progress Notes (Signed)
 Patient ID: Janet Scott, female   DOB: 05-07-1946, 78 y.o.   MRN: [id number]  Chief Complaint: Flat epithelial atypia, right breast  History of Present Illness She has undergone treatment and initiation of Eliquis for her atrial fibrillation.  Her rate appears to be well-controlled and we now have cardiology clearance to proceed with her excisional breast biopsy.  Scout tag was already in place.  She reports to me a right costal pain which seems to occur based on certain activities bending motion etc.  It never happens when at rest, she does not associated with a postprandial state.  But it has been sufficient to get her attention.  She cannot reproduce the pain on direct palpation. <13 months Ago:  Surgery was cancelled for new onset A-fib.    Previously: Janet Scott is a 78 y.o. female with recent cluster of calcifications, right breast.  Stereotactic biopsy shows flat epithelial atypia without any other premalignant changes.  Postbiopsy mammogram appears to have little to no residual calcs present.  Patient has had no prior breast history. She utilized oral birth control when reproductive.  She had a partial hysterectomy in June 1993.  She has no family history of breast cancer.  She began menstruating at the age of 8.  She is gravida 2 para 2.  She was 19 when she would have had her first pregnancy.  She has had no other breast symptoms including denying any nipple discharge, skin changes or prior breast pain.  She has never palpated a lump or any nodularity in either breast.  Past Medical History Past Medical History:  Diagnosis Date   Anemia    Arthritis    Leiomyoma 1993   Menorrhagia    Seasonal allergies       Past Surgical History:  Procedure Laterality Date   ABDOMINAL HYSTERECTOMY  02/1992   Total  ([facility])   ATRIAL FIBRILLATION ABLATION N/A 08/06/2023   Procedure: ATRIAL FIBRILLATION ABLATION;  Surgeon: Lanier Prude, MD;  Location: [facility];   Service: Cardiovascular;  Laterality: N/A;   BREAST BIOPSY Right 10/01/2022   Stereo Bx, Coil Clip, Path Pending   BREAST BIOPSY Right 10/01/2022   MM RT BREAST BX W LOC DEV 1ST LESION IMAGE BX SPEC STEREO GUIDE 10/01/2022 [facility]   BREAST BIOPSY Right 11/05/2022   MM RT RADIO FREQUENCY TAG LOC MAMMO GUIDE 11/05/2022 [facility]   CERVICAL SPINE SURGERY     DILATION AND CURETTAGE OF UTERUS  1983    Allergies  Allergen Reactions   Etodolac Photosensitivity and Rash    Current Outpatient Medications  Medication Sig Dispense Refill   acetaminophen (TYLENOL) 325 MG tablet Take 650 mg by mouth every 6 (six) hours as needed for moderate pain (pain score 4-6).     apixaban (ELIQUIS) 5 MG TABS tablet Take 1 tablet (5 mg total) by mouth 2 (two) times daily. 60 tablet 2   cholecalciferol (VITAMIN D3) 25 MCG (1000 UNIT) tablet Take 1,000 Units by mouth daily.     Multiple Vitamins-Minerals (WOMENS MULTIVITAMIN) TABS Take 1 tablet by mouth daily.     simethicone (MYLICON) 80 MG chewable tablet Chew 80 mg by mouth every 6 (six) hours as needed for flatulence.     verapamil (CALAN-SR) 120 MG CR tablet Take 1 tablet (120 mg total) by mouth 2 (two) times daily. 180 tablet 3   pantoprazole (PROTONIX) 40 MG tablet Take 1 tablet (40 mg total) by mouth daily. 45 tablet 0  No current facility-administered medications for this visit.    Family History Family History  Problem Relation Age of Onset   Other Father        "stomach flu"   Cancer Father        oral   Other Brother        suicide   Breast cancer Neg Hx       Social History Social History   Tobacco Use   Smoking status: Never    Passive exposure: Never   Smokeless tobacco: Never  Vaping Use   Vaping status: Never Used  Substance Use Topics   Alcohol use: No   Drug use: No        Review of Systems  Constitutional:  Positive for malaise/fatigue.  HENT: Negative.    Eyes: Negative.   Respiratory: Negative.     Cardiovascular: Negative.   Gastrointestinal: Negative.   Genitourinary: Negative.   Skin: Negative.   Neurological: Negative.   Psychiatric/Behavioral: Negative.       Physical Exam Blood pressure 135/78, pulse 78, temperature 98.2 F (36.8 C), height 5\' 1"  (1.549 m), weight 148 lb (67.1 kg), SpO2 97%. Last Weight  Most recent update: 11/26/2023  2:57 PM    Weight  67.1 kg (148 lb)              CONSTITUTIONAL: Well developed, and nourished, appropriately responsive and aware without distress.   EYES: Sclera non-icteric.   EARS, NOSE, MOUTH AND THROAT:  The oropharynx is clear. Oral mucosa is pink and moist.   Hearing is intact to voice.  NECK: Trachea is midline, and there is no jugular venous distension.  LYMPH NODES:  Lymph nodes in the neck are not appreciated. RESPIRATORY:   Normal respiratory effort without pathologic use of accessory muscles. CARDIOVASCULAR:  Well perfused.  GI: The abdomen is  soft, nontender, and nondistended.  There is no reproducible right costal tenderness, and no evidence of right upper quadrant tenderness on palpation. MUSCULOSKELETAL:  Symmetrical muscle tone appreciated in all four extremities.    SKIN: Skin turgor is normal. No pathologic skin lesions appreciated.  NEUROLOGIC:  Motor and sensation appear grossly normal.  Cranial nerves are grossly without defect. PSYCH:  Alert and oriented to person, place and time. Affect is appropriate for situation.  Data Reviewed I have personally reviewed what is currently available of the patient's imaging, recent labs and medical records.   Labs:     Latest Ref Rng & Units 07/13/2023   10:54 AM 11/13/2022   11:23 AM 06/09/2017    2:06 PM  CBC  WBC 4.0 - 10.5 K/uL 7.1  5.1  6.8   Hemoglobin 12.0 - 15.0 g/dL 16.1  09.6  04.5   Hematocrit 36.0 - 46.0 % 36.4  34.5  39.0   Platelets 150 - 400 K/uL 239  252  249       Latest Ref Rng & Units 07/13/2023   10:54 AM 11/13/2022   11:23 AM 06/09/2017     2:06 PM  CMP  Glucose 70 - 99 mg/dL 94  96  409   BUN 8 - 23 mg/dL 12  14  11    Creatinine 0.44 - 1.00 mg/dL 8.11  9.14  7.82   Sodium 135 - 145 mmol/L 133  133  139   Potassium 3.5 - 5.1 mmol/L 3.9  3.7  4.0   Chloride 98 - 111 mmol/L 97  98  105   CO2 22 - 32 mmol/L  27  26  25    Calcium 8.9 - 10.3 mg/dL 9.9  9.3  9.6   Total Protein 6.5 - 8.1 g/dL   8.1   Total Bilirubin 0.3 - 1.2 mg/dL   0.4   Alkaline Phos 38 - 126 U/L   52   AST 15 - 41 U/L   19   ALT 14 - 54 U/L   15    SURGICAL PATHOLOGY  CASE: [id number]  PATIENT: Samiksha Ruberg  Surgical Pathology Report   Specimen Submitted:  A. Breast, right   Clinical History: Small group indeterminate amorphous CALCS r/o atypia  or DCIS   DIAGNOSIS:  A. 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  - NO DEFINITE IN SITU CARCINOMA OR INVASION.    Imaging: Radiological images reviewed:  CLINICAL DATA:  Callback for RIGHT breast calcifications   EXAM: DIGITAL DIAGNOSTIC UNILATERAL RIGHT MAMMOGRAM WITH TOMOSYNTHESIS   TECHNIQUE: Right digital diagnostic mammography and breast tomosynthesis was performed.   COMPARISON:  Previous exam(s).   ACR Breast Density Category a: The breast tissue is almost entirely fatty.   FINDINGS: Spot magnification views of the RIGHT breast demonstrate a 7 mm group of amorphous and punctate calcifications in the RIGHT upper outer breast at posterior depth. These are not definitively stable in comparison to remote prior mammograms.   IMPRESSION: A 7 mm group of calcifications in the RIGHT breast is indeterminate. Recommend stereotactic guided biopsy for definitive characterization.   RECOMMENDATION: RIGHT breast stereotactic guided biopsy x1   I have discussed the findings and recommendations with the patient. The biopsy procedure was discussed with the patient and questions were answered. Patient  expressed their understanding of the biopsy recommendation. Patient will be scheduled for biopsy at her earliest convenience by the schedulers. Ordering provider will be notified. If applicable, a reminder letter will be sent to the patient regarding the next appointment.   BI-RADS CATEGORY  4: Suspicious.     Electronically Signed   By: Meda Klinefelter M.D.   On: 09/17/2022 10:06 Within last 24 hrs: No results found.  Assessment    Right upper quadrant pain. 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    Prior to this we hope to obtain both a right upper quadrant abdominal ultrasound to rule out cholelithiasis as well as diagnostic imaging of both breasts as it has been well over a year since any prior imaging.  She would like the peace of mind of doing all she can to minimize her risk and wants to proceed.  I discussed the available options with the patient.  Explained to the patient that after her surgical treatment additional treatment will depend on her prognostic indicators and stage.    They would like to proceed with right breast RFID localized excisional biopsy.  Face-to-face time spent with the patient and accompanying care providers(if present) was 30 minutes, with more than 50% of the time spent counseling, educating, and coordinating care of the patient.    These notes generated with voice recognition software. I apologize for typographical errors.  Campbell Lerner M.D., FACS 11/26/2023, 3:22 PM

## 2023-12-01 ENCOUNTER — Ambulatory Visit
Admission: RE | Admit: 2023-12-01 | Discharge: 2023-12-01 | Disposition: A | Discharge status: Home / Self Care | Source: Ambulatory Visit | Attending: Surgery | Admitting: Surgery

## 2023-12-01 DIAGNOSIS — R1084 Generalized abdominal pain: Secondary | ICD-10-CM | POA: Diagnosis not present

## 2023-12-01 DIAGNOSIS — R109 Unspecified abdominal pain: Secondary | ICD-10-CM | POA: Diagnosis not present

## 2023-12-21 ENCOUNTER — Ambulatory Visit
Admission: RE | Admit: 2023-12-21 | Discharge: 2023-12-21 | Disposition: A | Discharge status: Home / Self Care | Source: Ambulatory Visit | Attending: Surgery | Admitting: Surgery

## 2023-12-21 DIAGNOSIS — N6081 Other benign mammary dysplasias of right breast: Secondary | ICD-10-CM | POA: Diagnosis not present

## 2023-12-21 DIAGNOSIS — R92313 Mammographic fatty tissue density, bilateral breasts: Secondary | ICD-10-CM | POA: Diagnosis not present

## 2024-02-22 ENCOUNTER — Other Ambulatory Visit: Payer: Self-pay | Admitting: Cardiology

## 2024-03-30 DIAGNOSIS — Z0100 Encounter for examination of eyes and vision without abnormal findings: Secondary | ICD-10-CM | POA: Diagnosis not present

## 2024-03-30 DIAGNOSIS — H5203 Hypermetropia, bilateral: Secondary | ICD-10-CM | POA: Diagnosis not present

## 2024-04-18 ENCOUNTER — Encounter: Payer: Self-pay | Admitting: Cardiology
